# Patient Record
Sex: Female | Born: 1995
Health system: Southern US, Community
[De-identification: ages and names within clinical notes are randomized; demographics above are authoritative.]

## PROBLEM LIST (undated history)

## (undated) DIAGNOSIS — N189 Chronic kidney disease, unspecified: Secondary | ICD-10-CM

## (undated) DIAGNOSIS — E119 Type 2 diabetes mellitus without complications: Secondary | ICD-10-CM

## (undated) DIAGNOSIS — H6690 Otitis media, unspecified, unspecified ear: Secondary | ICD-10-CM

## (undated) HISTORY — DX: Type 2 diabetes mellitus without complications: E11.9

## (undated) HISTORY — DX: Otitis media, unspecified, unspecified ear: H66.90

## (undated) HISTORY — DX: Chronic kidney disease, unspecified: N18.9

---
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
## 2013-04-23 ENCOUNTER — Telehealth: Payer: Self-pay | Admitting: Family Medicine

## 2013-04-23 NOTE — Telephone Encounter (Signed)
Pt is wanting to know if she can get her novo long penn,lantes pen,losartin (pt can not get in touch with endocrinologist jeffery cure to refill it) she is wanting a call back to let her know if this can be done [employer] [address] back [phone]

## 2013-04-25 NOTE — Telephone Encounter (Signed)
LMTRC

## 2013-04-27 NOTE — Telephone Encounter (Signed)
LMTRC

## 2013-05-12 ENCOUNTER — Other Ambulatory Visit: Payer: Self-pay | Admitting: Family Medicine

## 2014-04-20 ENCOUNTER — Emergency Department (HOSPITAL_COMMUNITY)
Admission: EM | Admit: 2014-04-20 | Discharge: 2014-04-20 | Disposition: A | Discharge status: Home / Self Care | Payer: 59 | Source: Home / Self Care | Attending: Family Medicine | Admitting: Family Medicine

## 2014-04-20 ENCOUNTER — Encounter (HOSPITAL_COMMUNITY): Payer: Self-pay | Admitting: Emergency Medicine

## 2014-04-20 DIAGNOSIS — K5901 Slow transit constipation: Secondary | ICD-10-CM

## 2014-04-20 LAB — POCT URINALYSIS DIP (DEVICE)
BILIRUBIN URINE: NEGATIVE
Glucose, UA: 500 mg/dL — AB
KETONES UR: NEGATIVE mg/dL
LEUKOCYTES UA: NEGATIVE
Nitrite: NEGATIVE
PH: 7 (ref 5.0–8.0)
Protein, ur: 30 mg/dL — AB
SPECIFIC GRAVITY, URINE: 1.02 (ref 1.005–1.030)
Urobilinogen, UA: 0.2 mg/dL (ref 0.0–1.0)

## 2014-04-20 LAB — POCT PREGNANCY, URINE: Preg Test, Ur: NEGATIVE

## 2014-04-20 NOTE — ED Notes (Signed)
Pt states that she has had abdominal pain for 2 weeks. Pt reports no bowel movement in over 1 week

## 2014-04-20 NOTE — Discharge Instructions (Signed)
Miralax, citrucel, fluids, fiber and exercise. See your doctor if further problems.

## 2014-04-20 NOTE — ED Provider Notes (Signed)
CSN: [id number]     Arrival date & time 04/20/14  1834 History   First MD Initiated Contact with Patient 04/20/14 1859     Chief Complaint  Patient presents with  . Abdominal Pain   (Consider location/radiation/quality/duration/timing/severity/associated sxs/prior Treatment) Patient is a 18 y.o. female presenting with abdominal pain. The history is provided by the patient and a parent.  Abdominal Pain Pain location:  Generalized Pain quality: cramping   Pain radiates to:  Does not radiate Pain severity:  Mild Onset quality:  Gradual Duration:  2 weeks Chronicity:  Chronic Context: diet changes   Context comment:  No bm for 1 wk, h/o constipation. Associated symptoms: constipation   Associated symptoms: no anorexia, no belching, no chest pain, no diarrhea, no dysuria, no fever and no nausea     Past Medical History  Diagnosis Date  . OM (otitis media), recurrent     Recent bilat rupture of TM Feb 2013  . Diabetes mellitus without complication   . Chronic kidney disease     Stage I   History reviewed. No pertinent past surgical history. Family History  Problem Relation Age of Onset  . Hyperlipidemia Mother   . Depression Mother   . Diabetes Paternal Uncle   . Diabetes Paternal Grandfather    History  Substance Use Topics  . Smoking status: Never Smoker   . Smokeless tobacco: Not on file  . Alcohol Use: No   OB History    No data available     Review of Systems  Constitutional: Negative for fever.  Cardiovascular: Negative for chest pain.  Gastrointestinal: Positive for abdominal pain and constipation. Negative for nausea, diarrhea and anorexia.  Genitourinary: Negative.  Negative for dysuria.    Allergies  Review of patient's allergies indicates no known allergies.  Home Medications   Prior to Admission medications   Medication Sig Start Date End Date Taking? Authorizing Provider  desogestrel-ethinyl estradiol (APRI,EMOQUETTE,SOLIA) 0.15-30 MG-MCG tablet  Take 1 tablet by mouth daily.    Historical Provider, MD  insulin aspart (NOVOLOG) 100 UNIT/ML injection Inject 4 Units into the skin 3 (three) times daily before meals.    Historical Provider, MD  insulin glargine (LANTUS) 100 UNIT/ML injection Inject 12 units into skin at bedtime 09/01/11   Salley ScarletKawanta F [place], MD  Insulin Pen Needle 31G X 8 MM MISC Lantus solarstar Insulin pen needlesDx 250.00. Test CBG QAC, QHS 09/01/11   Salley ScarletKawanta F [place], MD  Lancets MISC Test cbg as directed 09/01/11   Salley ScarletKawanta F [place], MD  losartan (COZAAR) 25 MG tablet Take 25 mg by mouth daily.    Historical Provider, MD  ONE TOUCH ULTRA TEST test strip TEST 4 TIMES DAILY, BEFORE MEALS AND AT BEDTIME. 05/12/13   Salley ScarletKawanta F [place], MD   BP 147/77 mmHg  Pulse 106  Temp(Src) 99.4 F (37.4 C) (Oral)  Resp 16  SpO2 100%  LMP 04/16/2014 Physical Exam  Constitutional: She is oriented to person, place, and time. She appears well-developed and well-nourished. No distress.  Neck: Normal range of motion. Neck supple.  Cardiovascular: Regular rhythm and normal heart sounds.   Pulmonary/Chest: Effort normal and breath sounds normal.  Abdominal: Soft. Bowel sounds are normal. She exhibits no distension and no mass. There is no tenderness. There is no rebound and no guarding.  Lymphadenopathy:    She has no cervical adenopathy.  Neurological: She is alert and oriented to person, place, and time.  Skin: Skin is warm and dry.  Nursing note  and vitals reviewed.   ED Course  Procedures (including critical care time) Labs Review Labs Reviewed  POCT URINALYSIS DIP (DEVICE) - Abnormal; Notable for the following:    Glucose, UA 500 (*)    Hgb urine dipstick TRACE (*)    Protein, ur 30 (*)    All other components within normal limits  POCT PREGNANCY, URINE    Imaging Review No results found.   MDM   1. Slow transit constipation        Linna HoffJames D Emillio Ngo, MD 04/20/14 1932

## 2014-04-22 ENCOUNTER — Encounter: Payer: Self-pay | Admitting: Physician Assistant

## 2014-04-22 ENCOUNTER — Ambulatory Visit (INDEPENDENT_AMBULATORY_CARE_PROVIDER_SITE_OTHER): Payer: 59 | Admitting: Physician Assistant

## 2014-04-22 VITALS — BP 110/76 | HR 100 | Temp 99.2°F | Resp 18 | Wt 156.0 lb

## 2014-04-22 DIAGNOSIS — E109 Type 1 diabetes mellitus without complications: Secondary | ICD-10-CM

## 2014-04-22 DIAGNOSIS — K5900 Constipation, unspecified: Secondary | ICD-10-CM

## 2014-04-22 DIAGNOSIS — R1033 Periumbilical pain: Secondary | ICD-10-CM

## 2014-04-22 NOTE — Progress Notes (Signed)
Patient ID: Gabriel RainwaterKija Emonie Broccoli MRN: [id number], DOB: Apr 09, 1996, 18 y.o. Date of Encounter: @DATE @  Chief Complaint:  Chief Complaint  Patient presents with  . /abd pain    no BM in a week,  went to UC told to take Miralax and Metamucil but no help    HPI: 18 y.o. year old AA female  presents with above symptoms.   I reviewed urgent care note from 04/20/14. They performed a urine and a urine pregnancy which was negative.  Patient says that since summer time ( approx 6 months)  she has had some mild constipation. Says that she would have to strain and her stool would be hard. However it was not requiring any treatment.  She went to the urgent care on 04/20/14. She says that the week prior to that urgent care visit she wasn't having a lot of bowel movement but was having some until just 2 days prior to the urgent care visit-- she had not had BM any at all for 2 days prior to U/C visit.  The urgent care recommended MiraLAX and Metamucil. She says that since the urgent care visit she has been using both of these. She is taking MiraLAX one cap full per day. She is taking Metamucil 1 tablespoon 3 times a day. Says that she has also increased fluid intake. She says that she has had no BM come out at all.  She reports that she has no GI/abdominal history. She reports that she has never had any abdominal surgeries.   Past Medical History  Diagnosis Date  . OM (otitis media), recurrent     Recent bilat rupture of TM Feb 2013  . Diabetes mellitus without complication   . Chronic kidney disease     Stage I     Home Meds: Outpatient Prescriptions Prior to Visit  Medication Sig Dispense Refill  . desogestrel-ethinyl estradiol (APRI,EMOQUETTE,SOLIA) 0.15-30 MG-MCG tablet Take 1 tablet by mouth daily.    . insulin aspart (NOVOLOG) 100 UNIT/ML injection Inject 4 Units into the skin 3 (three) times daily before meals.    . Insulin Pen Needle 31G X 8 MM MISC Lantus solarstar Insulin  pen needlesDx 250.00. Test CBG QAC, QHS 100 each 6  . Lancets MISC Test cbg as directed 100 each 6  . losartan (COZAAR) 25 MG tablet Take 25 mg by mouth daily.    . ONE TOUCH ULTRA TEST test strip TEST 4 TIMES DAILY, BEFORE MEALS AND AT BEDTIME. 150 each 11  . insulin glargine (LANTUS) 100 UNIT/ML injection Inject 12 units into skin at bedtime     No facility-administered medications prior to visit.    Allergies: No Known Allergies  History   Social History  . Marital Status: Single    Spouse Name: N/A    Number of Children: N/A  . Years of Education: N/A   Occupational History  . Not on file.   Social History Main Topics  . Smoking status: Never Smoker   . Smokeless tobacco: Not on file  . Alcohol Use: No  . Drug Use: No  . Sexual Activity: Not on file   Other Topics Concern  . Not on file   Social History Narrative    Family History  Problem Relation Age of Onset  . Hyperlipidemia Mother   . Depression Mother   . Diabetes Paternal Uncle   . Diabetes Paternal Grandfather      Review of Systems:  See HPI for pertinent ROS. All  other ROS negative.    Physical Exam: Blood pressure 110/76, pulse 100, temperature 99.2 F (37.3 C), temperature source Oral, resp. rate 18, weight 156 lb (70.761 kg), last menstrual period 04/16/2014., Body mass index is 27.4 kg/(m^2). General: WNWD AAF. Appears in no distress at all. Appears in no acute distress. Neck: Supple. No thyromegaly. No lymphadenopathy. Lungs: Clear bilaterally to auscultation without wheezes, rales, or rhonchi. Breathing is unlabored. Heart: RRR with S1 S2. No murmurs, rubs, or gallops. Abdomen: Soft,  non-distended with normoactive bowel sounds. No hepatomegaly. No rebound/guarding. No obvious abdominal masses. Very minimal tenderness with palpation of periumbilical area. No other area of tenderness. No tenderness with palpation of right lower quadrant.  Musculoskeletal:  Strength and tone normal for  age. Extremities/Skin: Warm and dry. Neuro: Alert and oriented X 3. Moves all extremities spontaneously. Gait is normal. CNII-XII grossly in tact. Psych:  Responds to questions appropriately with a normal affect.     ASSESSMENT AND PLAN:  [age] y.o. year old female with  1. Constipation, unspecified constipation type - DG Abd 1 View; Future  2. Periumbilical abdominal pain - DG Abd 1 View; Future  3. Type I diabetes mellitus, well controlled  Will obtain KUB. If this is normal except for constipation, then will have her increase Mirilax.  F/U if symptoms worsen/change.    [address]igned, Charmika Macdonnell [place], [place], [facility] 04/22/2014 4:38 PM

## 2014-04-23 ENCOUNTER — Ambulatory Visit
Admission: RE | Admit: 2014-04-23 | Discharge: 2014-04-23 | Disposition: A | Discharge status: Home / Self Care | Payer: 59 | Source: Ambulatory Visit | Attending: Physician Assistant | Admitting: Physician Assistant

## 2014-04-23 DIAGNOSIS — R1033 Periumbilical pain: Secondary | ICD-10-CM

## 2014-04-23 DIAGNOSIS — K5900 Constipation, unspecified: Secondary | ICD-10-CM

## 2014-04-24 ENCOUNTER — Telehealth: Payer: Self-pay | Admitting: Family Medicine

## 2014-04-24 NOTE — Telephone Encounter (Signed)
Patient is calling to get xray results  [phone]

## 2014-04-24 NOTE — Telephone Encounter (Signed)
Patient returned call and made aware.

## 2014-04-25 ENCOUNTER — Emergency Department (HOSPITAL_COMMUNITY)
Admission: EM | Admit: 2014-04-25 | Discharge: 2014-04-25 | Disposition: A | Discharge status: Home / Self Care | Payer: 59 | Attending: Emergency Medicine | Admitting: Emergency Medicine

## 2014-04-25 ENCOUNTER — Emergency Department (HOSPITAL_COMMUNITY): Payer: 59

## 2014-04-25 ENCOUNTER — Encounter (HOSPITAL_COMMUNITY): Payer: Self-pay | Admitting: Emergency Medicine

## 2014-04-25 DIAGNOSIS — N3000 Acute cystitis without hematuria: Secondary | ICD-10-CM | POA: Diagnosis not present

## 2014-04-25 DIAGNOSIS — Z3202 Encounter for pregnancy test, result negative: Secondary | ICD-10-CM | POA: Diagnosis not present

## 2014-04-25 DIAGNOSIS — E119 Type 2 diabetes mellitus without complications: Secondary | ICD-10-CM | POA: Insufficient documentation

## 2014-04-25 DIAGNOSIS — R109 Unspecified abdominal pain: Secondary | ICD-10-CM | POA: Diagnosis present

## 2014-04-25 DIAGNOSIS — N181 Chronic kidney disease, stage 1: Secondary | ICD-10-CM | POA: Insufficient documentation

## 2014-04-25 DIAGNOSIS — Z8669 Personal history of other diseases of the nervous system and sense organs: Secondary | ICD-10-CM | POA: Insufficient documentation

## 2014-04-25 DIAGNOSIS — R1033 Periumbilical pain: Secondary | ICD-10-CM

## 2014-04-25 DIAGNOSIS — Z794 Long term (current) use of insulin: Secondary | ICD-10-CM | POA: Insufficient documentation

## 2014-04-25 DIAGNOSIS — Z79899 Other long term (current) drug therapy: Secondary | ICD-10-CM | POA: Diagnosis not present

## 2014-04-25 DIAGNOSIS — K5900 Constipation, unspecified: Secondary | ICD-10-CM | POA: Diagnosis not present

## 2014-04-25 LAB — URINALYSIS, ROUTINE W REFLEX MICROSCOPIC
Bilirubin Urine: NEGATIVE
Glucose, UA: >1000 mg/dL — AB
Ketones, ur: 15 mg/dL — AB
Nitrite: NEGATIVE
Protein, ur: NEGATIVE mg/dL
Specific Gravity, Urine: 1.019 (ref 1.005–1.030)
Urobilinogen, UA: 1 mg/dL (ref 0.0–1.0)
pH: 7 (ref 5.0–8.0)

## 2014-04-25 LAB — CBC WITH DIFFERENTIAL/PLATELET
Basophils Absolute: 0.1 10*3/uL (ref 0.0–0.1)
Basophils Relative: 1 % (ref 0–1)
Eosinophils Absolute: 0 10*3/uL (ref 0.0–0.7)
Eosinophils Relative: 0 % (ref 0–5)
HCT: 37.8 % (ref 36.0–46.0)
Hemoglobin: 13.2 g/dL (ref 12.0–15.0)
Lymphocytes Relative: 51 % — ABNORMAL HIGH (ref 12–46)
Lymphs Abs: 4.4 10*3/uL — ABNORMAL HIGH (ref 0.7–4.0)
MCH: 31.9 pg (ref 26.0–34.0)
MCHC: 34.9 g/dL (ref 30.0–36.0)
MCV: 91.3 fL (ref 78.0–100.0)
Monocytes Absolute: 0.7 10*3/uL (ref 0.1–1.0)
Monocytes Relative: 8 % (ref 3–12)
Neutro Abs: 3.5 10*3/uL (ref 1.7–7.7)
Neutrophils Relative %: 40 % — ABNORMAL LOW (ref 43–77)
Platelets: 261 10*3/uL (ref 150–400)
RBC: 4.14 MIL/uL (ref 3.87–5.11)
RDW: 13 % (ref 11.5–15.5)
WBC: 8.7 10*3/uL (ref 4.0–10.5)

## 2014-04-25 LAB — COMPREHENSIVE METABOLIC PANEL
ALT: 37 U/L — ABNORMAL HIGH (ref 0–35)
AST: 33 U/L (ref 0–37)
Albumin: 3.2 g/dL — ABNORMAL LOW (ref 3.5–5.2)
Alkaline Phosphatase: 131 U/L — ABNORMAL HIGH (ref 39–117)
Anion gap: 13 (ref 5–15)
BUN: 10 mg/dL (ref 6–23)
CO2: 24 mEq/L (ref 19–32)
Calcium: 9.6 mg/dL (ref 8.4–10.5)
Chloride: 97 mEq/L (ref 96–112)
Creatinine, Ser: 0.6 mg/dL (ref 0.50–1.10)
GFR calc Af Amer: >90 mL/min (ref >90)
GFR calc non Af Amer: >90 mL/min (ref >90)
Glucose, Bld: 286 mg/dL — ABNORMAL HIGH (ref 70–99)
Potassium: 4.2 mEq/L (ref 3.7–5.3)
Sodium: 134 mEq/L — ABNORMAL LOW (ref 137–147)
Total Bilirubin: 0.5 mg/dL (ref 0.3–1.2)
Total Protein: 7.4 g/dL (ref 6.0–8.3)

## 2014-04-25 LAB — URINE MICROSCOPIC-ADD ON

## 2014-04-25 LAB — LIPASE, BLOOD: Lipase: 41 U/L (ref 11–59)

## 2014-04-25 LAB — PREGNANCY, URINE: Preg Test, Ur: NEGATIVE

## 2014-04-25 MED ORDER — IBUPROFEN 800 MG PO TABS: 800.0000 mg | ORAL_TABLET | Freq: Three times a day (TID) | ORAL | Status: DC | PRN

## 2014-04-25 MED ORDER — NITROFURANTOIN MONOHYD MACRO 100 MG PO CAPS: 100.0000 mg | ORAL_CAPSULE | Freq: Two times a day (BID) | ORAL | Status: DC

## 2014-04-25 MED ORDER — SODIUM CHLORIDE 0.9 % IV BOLUS (SEPSIS)
1000.0000 mL | Freq: Once | INTRAVENOUS | Status: AC
  Administered 2014-04-25: 1000 mL via INTRAVENOUS

## 2014-04-25 MED ORDER — KETOROLAC TROMETHAMINE 30 MG/ML IJ SOLN
30.0000 mg | Freq: Once | INTRAMUSCULAR | Status: AC
  Administered 2014-04-25: 30 mg via INTRAVENOUS
  Filled 2014-04-25: qty 1

## 2014-04-25 MED ORDER — IOHEXOL 300 MG/ML  SOLN
100.0000 mL | Freq: Once | INTRAMUSCULAR | Status: AC | PRN
  Administered 2014-04-25: 100 mL via INTRAVENOUS

## 2014-04-25 MED ORDER — IOHEXOL 300 MG/ML  SOLN
25.0000 mL | Freq: Once | INTRAMUSCULAR | Status: AC | PRN
  Administered 2014-04-25: 25 mL via ORAL

## 2014-04-25 MED ORDER — LACTULOSE 10 GM/15ML PO SOLN: 10.0000 g | Freq: Two times a day (BID) | ORAL | Status: DC

## 2014-04-25 NOTE — ED Notes (Signed)
Pt having L abdominal movement. Saturday was last BM; nauseated. Fevers since Thursday. [facility] and was given medicine for back pain. Pt is Type I diabetic. Pain started in back and moved to stomach. Has seen 3 MD's and fevers persisting. Had abdominal xray and no obstruction.

## 2014-04-25 NOTE — ED Notes (Signed)
CT called to update that pt finished contrast

## 2014-04-25 NOTE — ED Notes (Signed)
Pt returned from CT °

## 2014-04-25 NOTE — Discharge Instructions (Signed)
Return here as needed.  Follow-up with your primary care Dr. increase her fluid intake, rest as much as possible.  Tylenol and Motrin for any pain or fever

## 2014-04-25 NOTE — ED Provider Notes (Signed)
CSN: [id number]     Arrival date & time 04/25/14  [id number] History   First MD Initiated Contact with Patient 04/25/14 1005     Chief Complaint  Patient presents with  . Abdominal Pain     (Consider location/radiation/quality/duration/timing/severity/associated sxs/prior Treatment) HPI Patient presents to the emergency department with abdominal pain that has been ongoing over the last week.  The patient, states she has had also constipation over that time frame.  She was seen in urgent care for similar symptoms.  Patient, states she has been nauseated with fevers.  The patient states that nothing seems to make her condition better or worse.  The patient denies headache, blurred vision, weakness, dizziness, lethargy, chest pain, shortness of breath, cough, runny nose, sore throat, back pain, dysuria, lightheadedness or syncope or syncope.  The patient states that she did not take any other medications prior to arrival.  The patient states that nothing seems make her condition better or worse. Past Medical History  Diagnosis Date  . OM (otitis media), recurrent     Recent bilat rupture of TM Feb 2013  . Diabetes mellitus without complication   . Chronic kidney disease     Stage I   History reviewed. No pertinent past surgical history. Family History  Problem Relation Age of Onset  . Hyperlipidemia Mother   . Depression Mother   . Diabetes Paternal Uncle   . Diabetes Paternal Grandfather    History  Substance Use Topics  . Smoking status: Never Smoker   . Smokeless tobacco: Not on file  . Alcohol Use: No   OB History    No data available     Review of Systems All other systems negative except as documented in the HPI. All pertinent positives and negatives as reviewed in the HPI.   Allergies  Review of patient's allergies indicates no known allergies.  Home Medications   Prior to Admission medications   Medication Sig Start Date End Date Taking? Authorizing Provider    ibuprofen (ADVIL,MOTRIN) 200 MG tablet Take 200 mg by mouth every 6 (six) hours as needed for mild pain or moderate pain.   Yes Historical Provider, MD  insulin aspart (NOVOLOG) 100 UNIT/ML injection Inject 4 Units into the skin 3 (three) times daily before meals.   Yes Historical Provider, MD  Insulin Pen Needle 31G X 8 MM MISC Lantus solarstar Insulin pen needlesDx 250.00. Test CBG QAC, QHS 09/01/11  Yes Salley ScarletKawanta F [place], MD  Lancets MISC Test cbg as directed 09/01/11  Yes Salley ScarletKawanta F [place], MD  losartan (COZAAR) 25 MG tablet Take 25 mg by mouth daily.   Yes Historical Provider, MD  ONE TOUCH ULTRA TEST test strip TEST 4 TIMES DAILY, BEFORE MEALS AND AT BEDTIME. 05/12/13  Yes Salley ScarletKawanta F , MD  TRESIBA FLEXTOUCH 100 UNIT/ML SOPN Inject 12 Units into the skin at bedtime. 04/05/14  Yes Historical Provider, MD   BP 132/80 mmHg  Pulse 98  Temp(Src) 99.8 F (37.7 C) (Oral)  Resp 22  SpO2 100%  LMP 04/16/2014 (Approximate) Physical Exam  Constitutional: She is oriented to person, place, and time. She appears well-developed and well-nourished. No distress.  HENT:  Head: Normocephalic and atraumatic.  Mouth/Throat: Oropharynx is clear and moist.  Eyes: Pupils are equal, round, and reactive to light.  Neck: Normal range of motion. Neck supple.  Cardiovascular: Normal rate, regular rhythm and normal heart sounds.  Exam reveals no gallop and no friction rub.   No murmur heard. Pulmonary/Chest: Effort  normal and breath sounds normal. No respiratory distress.  Abdominal: Soft. Bowel sounds are normal. She exhibits no distension. There is tenderness. There is no rebound and no guarding.    Musculoskeletal: She exhibits no edema.  Neurological: She is alert and oriented to person, place, and time. She exhibits normal muscle tone. Coordination normal.  Skin: Skin is warm and dry. No erythema.  Psychiatric: She has a normal mood and affect. Her behavior is normal.  Nursing note and vitals  reviewed.   ED Course  Procedures (including critical care time) Labs Review Labs Reviewed  URINALYSIS, ROUTINE W REFLEX MICROSCOPIC - Abnormal; Notable for the following:    Glucose, UA >1000 (*)    Hgb urine dipstick TRACE (*)    Ketones, ur 15 (*)    Leukocytes, UA MODERATE (*)    All other components within normal limits  COMPREHENSIVE METABOLIC PANEL - Abnormal; Notable for the following:    Sodium 134 (*)    Glucose, Bld 286 (*)    Albumin 3.2 (*)    ALT 37 (*)    Alkaline Phosphatase 131 (*)    All other components within normal limits  CBC WITH DIFFERENTIAL - Abnormal; Notable for the following:    Neutrophils Relative % 40 (*)    Lymphocytes Relative 51 (*)    Lymphs Abs 4.4 (*)    All other components within normal limits  URINE CULTURE  PREGNANCY, URINE  LIPASE, BLOOD  URINE MICROSCOPIC-ADD ON    Imaging Review Ct Abdomen Pelvis W Contrast  04/25/2014   CLINICAL DATA:  Lower abdomen and back pain for 2 weeks radiating towards umbilicus, nausea  EXAM: CT ABDOMEN AND PELVIS WITH CONTRAST  TECHNIQUE: Multidetector CT imaging of the abdomen and pelvis was performed using the standard protocol following bolus administration of intravenous contrast.  CONTRAST:  100mL OMNIPAQUE IOHEXOL 300 MG/ML  SOLN  COMPARISON:  Abdomen film of 04/23/2014  FINDINGS: The lung bases are clear other than mild bibasilar dependent atelectasis. If there are any clinical symptoms referable to pneumonia, developing pneumonia at the lung bases would be difficult to exclude and followup would be recommended.  The liver enhances with no focal abnormality and no ductal dilatation is seen. No calcified gallstones are noted. The pancreas is normal in size in the pancreatic duct is not dilated. The adrenal glands and spleen are unremarkable. The stomach is decompressed. The kidneys enhance with no calculus or mass and there is no evidence of hydronephrosis. The abdominal aorta is normal in caliber. No  adenopathy is seen.  The uterus is slightly to the left midline. A small amount of free fluid is noted in the pelvis. The urinary bladder is unremarkable. No adnexal lesion is seen with probable small follicles present bilaterally. There is a moderate amount of feces throughout the colon. The terminal ileum and the appendix are unremarkable. The lumbar vertebrae are in normal alignment with normal disc spaces.  IMPRESSION: 1. No definite explanation for the patient's pain is seen. There is a small amount of free fluid in the pelvis and a ruptured ovarian cyst cannot be excluded. 2. The appendix and terminal ileum are unremarkable. 3. Probable bibasilar dependent atelectasis. If there are any clinical symptoms referable to developing pneumonia then followup chest x-ray may be helpful.   Electronically Signed   By: Dwyane DeePaul  Barry M.D.   On: 04/25/2014 13:14    Patient does have some white cells noted in the urine.  We will treat her for this  as a presumptive measured due to the fact she is diabetic.  The patient's vital signs of improvement.  Patient is feeling some better.  I will treat for constipation as she is had some treatment with MiraLAX over the last few days.  The patient is advised to increase her fluid intake, rest as much as possible.  Follow with her primary care doctor.  Return here for any worsening in her condition  MDM   Final diagnoses:  None       Carlyle Dolly, PA-C 04/25/14 1500  Mirian Mo, MD 04/29/14 [phone]

## 2014-04-29 ENCOUNTER — Telehealth: Payer: Self-pay | Admitting: Family Medicine

## 2014-04-29 LAB — URINE CULTURE: Colony Count: >=100000

## 2014-04-29 MED ORDER — FLUCONAZOLE 150 MG PO TABS: 150.0000 mg | ORAL_TABLET | Freq: Once | ORAL | Status: DC

## 2014-04-29 NOTE — Telephone Encounter (Signed)
Seen in ED last week.  Was given antibiotics.  Now developing yeast infection.  Wants Rx if possible.

## 2014-04-29 NOTE — Telephone Encounter (Signed)
Okay to send in diflucan x 1 , repeat in 3 days

## 2014-04-29 NOTE — Telephone Encounter (Signed)
RX to pharmacy and pt aware 

## 2014-04-30 ENCOUNTER — Telehealth (HOSPITAL_COMMUNITY): Payer: Self-pay

## 2014-04-30 NOTE — ED Notes (Signed)
Post ED Visit - Positive Culture Follow-up  Culture report reviewed by antimicrobial stewardship pharmacist: [x]  Wes Dulaney, Pharm.D., BCPS []  Celedonio MiyamotoJeremy Frens, Pharm.D., BCPS []  Georgina PillionElizabeth Martin, Pharm.D., BCPS []  GarnerMinh Pham, [address]Ph[address]D., BCPS, AAHIVP []  Estella HuskMichelle Turner, Pharm.D., BCPS, AAHIVP []  Elder CyphersLorie Poole, [address]Pharm[address]D., BCPS  Positive urine culture Treated with nitrofurantoin, organism sensitive to the same and no further patient follow-up is required at this time.  Ashley JacobsFesterman, Adrea Sherpa C 04/30/2014, 10:33 AM

## 2014-05-01 ENCOUNTER — Encounter: Payer: Self-pay | Admitting: Family Medicine

## 2014-05-01 ENCOUNTER — Other Ambulatory Visit: Payer: Self-pay | Admitting: Family Medicine

## 2014-05-01 ENCOUNTER — Ambulatory Visit (INDEPENDENT_AMBULATORY_CARE_PROVIDER_SITE_OTHER): Payer: 59 | Admitting: Family Medicine

## 2014-05-01 ENCOUNTER — Telehealth: Payer: Self-pay | Admitting: Family Medicine

## 2014-05-01 VITALS — BP 118/70 | HR 86 | Temp 98.6°F | Resp 18 | Ht 65.0 in | Wt 153.0 lb

## 2014-05-01 DIAGNOSIS — R7982 Elevated C-reactive protein (CRP): Secondary | ICD-10-CM

## 2014-05-01 DIAGNOSIS — R1032 Left lower quadrant pain: Secondary | ICD-10-CM

## 2014-05-01 DIAGNOSIS — N760 Acute vaginitis: Secondary | ICD-10-CM

## 2014-05-01 LAB — WET PREP FOR TRICH, YEAST, CLUE
Trich, Wet Prep: NONE SEEN
Yeast Wet Prep HPF POC: NONE SEEN

## 2014-05-01 MED ORDER — SULFAMETHOXAZOLE-TRIMETHOPRIM 800-160 MG PO TABS: 1.0000 | ORAL_TABLET | Freq: Two times a day (BID) | ORAL | Status: DC

## 2014-05-01 NOTE — Telephone Encounter (Signed)
Patient is calling to give more information about an emergency appointment she is supposed to have  [phone]

## 2014-05-01 NOTE — Patient Instructions (Signed)
Stop the macrobid Start bactrim Take 1 diflucan now and repeat at end of antibiotics Get labs done Next week  F/U pending results

## 2014-05-01 NOTE — Telephone Encounter (Signed)
Notes and labs need to be obtained, I have not seen her since last year and I did not send her to GI

## 2014-05-01 NOTE — Telephone Encounter (Signed)
Call placed to patient.   Reports that she was seen by GI and was advised to F/U with PCP d/t elevated CRP.  Appointment scheduled.

## 2014-05-01 NOTE — Progress Notes (Signed)
Patient ID: Nicole RainwaterKija Emonie Hayward, female   DOB: 04/02/1996, 18 y.o.   MRN: [id number]   Subjective:    Patient ID: Nicole Duarte, female    DOB: 04/02/1996, 18 y.o.   MRN: [id number]  Patient presents for F/U Labs patient here to follow-up. She was seen on the 15th secondary to left lower quadrant pain a KUB was done which shows severe constipation which she had been having difficulty with. She then went to the ER 2 days later because she was having some fever as well as persistent pain CT scan was done which was unremarkable with the exception of a small amount of free pelvic fluid consistent with a ruptured ovarian cyst. She was started on antibiotics secondary to possible urine tract infection and the culture did come back with staph aureus she was already on Macrobid she did not have any significant UTI symptoms. She does state that the antibiotic is causing itching and she now has vaginal discharge. She then went to follow with gastroenterology because CT scan did show the constipation which was already revealed on the x-ray. Labs were done which showed an elevated CRP at 132 which suggested a possible inflammatory process her thyroid level was unremarkable she had some other transglutaminase levels looking for celiac which were also negative Upreg neg LMP 04/17/14 Abdominal pain at this point is very minimal, she also now has some cough and congestion    Review Of Systems:  GEN- denies fatigue, fever, weight loss,weakness, recent illness HEENT- denies eye drainage, change in vision, nasal discharge, CVS- denies chest pain, palpitations RESP- denies SOB, cough, wheeze ABD- denies N/V, change in stools, +abd pain GU- denies dysuria, hematuria, dribbling, incontinence MSK- denies joint pain, muscle aches, injury Neuro- denies headache, dizziness, syncope, seizure activity       Objective:    BP 118/70 mmHg  Pulse 86  Temp(Src) 98.6 F (37 C) (Oral)  Resp 18  Ht 5\' 5"  (1.651 m)   Wt 153 lb (69.4 kg)  BMI 25.46 kg/m2  LMP 04/16/2014 (Approximate) GEN- NAD, alert and oriented x3 HEENT- PERRL, EOMI, non injected sclera, pink conjunctiva, MMM, oropharynx clear, no maxillary sinus tenderness, nares clear, hoarse voice Neck- Supple, no LAD CVS- RRR, no murmur RESP-CTAB ABD-NABS,soft,mild TTP LLQ no rebound, no gaurding GU- normal external genitalia, vaginal mucosa pink and moist, cervix visualized no growth, no blood form os, + THICK WHITE discharge, no CMT, no ovarian masses, uterus normal size Pulses- Radial 2+        Assessment & Plan:      Problem List Items Addressed This Visit    None    Visit Diagnoses    Vaginitis and vulvovaginitis    -  Primary    Advised to take Diflucan x 1 dose now and repeat at end of antibotics, in setting of her DM, she does get Yeast infection a lot    Relevant Orders       GC/chlamydia probe amp, genital       WET PREP FOR TRICH, YEAST, CLUE (Completed)    Elevated C-reactive protein (CRP)        Plan to recheck her CRP, sed rate, and CBC    Relevant Orders       C-reactive protein       Sedimentation Rate    Abdominal pain, left lower quadrant        possible related to both constipation and ovarian cyst, pelvic culture done, will hold on pelvic US unless  pain returns, will d/c macrobid and chagne to bactrim    Relevant Orders       CBC with Differential       Comprehensive metabolic panel       Note: This dictation was prepared with Dragon dictation along with smaller phrase technology. Any transcriptional errors that result from this process are unintentional.

## 2014-05-02 LAB — GC/CHLAMYDIA PROBE AMP
CT PROBE, AMP APTIMA: NEGATIVE
GC PROBE AMP APTIMA: NEGATIVE

## 2014-05-09 ENCOUNTER — Encounter: Payer: Self-pay | Admitting: *Deleted

## 2014-05-15 ENCOUNTER — Encounter: Payer: Self-pay | Admitting: *Deleted

## 2014-05-15 ENCOUNTER — Encounter: Payer: Self-pay | Attending: Internal Medicine | Admitting: *Deleted

## 2014-05-15 DIAGNOSIS — Z713 Dietary counseling and surveillance: Secondary | ICD-10-CM | POA: Insufficient documentation

## 2014-05-15 DIAGNOSIS — E109 Type 1 diabetes mellitus without complications: Secondary | ICD-10-CM | POA: Insufficient documentation

## 2014-05-15 DIAGNOSIS — Z794 Long term (current) use of insulin: Secondary | ICD-10-CM | POA: Insufficient documentation

## 2014-05-15 NOTE — Patient Instructions (Signed)
Plan: Consider trying new infusion set: Inset 30 to see if you get better delivery from your T-Slim Consider using Food Group Carb Counting when gram info not available If the new infusion set isn't successful we will discuss other pump company options at next visit.

## 2014-05-15 NOTE — Progress Notes (Signed)
Diabetes Self-Management Education  Visit Type:  Initial visit for pump evaluation  Appt. Start Time: 1030 Appt. End Time: 1200  05/15/2014  Nicole Duarte, identified by name and date of birth, is a 19 y.o. female with a diagnosis of Diabetes: Type 1 (Diagnosed 08/2011).  Other people present during visit:  Patient, Family Member   ASSESSMENT  Last menstrual period 04/16/2014. There is no weight on file to calculate BMI.  Initial Visit Information:  Are you currently following a meal plan?: Yes What type of meal plan do you follow?: 1 unit per 12 grams Carb, she feels her carb counting ability is a 6 out of 10 Are you taking your medications as prescribed?: Yes Are you checking your feet?: Yes How many days per week are you checking your feet?: 7 How often do you need to have someone help you when you read instructions, pamphlets, or other written materials from your doctor or pharmacy?: 1 - Never What is the last grade level you completed in school?: college, pre-med, biology  Psychosocial:     Self-care barriers: None Self-management support: Family Other persons present: Patient, Family Member  Complications:   Last HgB A1C per patient/outside source: 7.5 % per patient report How often do you check your blood sugar?: 3-4 times/day Number of hypoglycemic episodes per month: 4 Have you had a dental exam in the past 12 months?: Yes  Diet Intake:  Breakfast: Malaw[place]urkey bacon, eggs, 2 pancakes with SF syrup, water Snack (morning): fresh fruit Lunch: eats on campus, sushi OR Chic Filet sandwich on whole wheat with cheese at food court, water Snack (afternoon): not usually Dinner: fish, vegetables cooked or salad, occasionally a potato, water Snack (evening): depends on BG, will eat if BG is lower Beverage(s): water  Exercise:  Exercise: Moderate (swimming / aerobic walking) (runs at gym for 30 minutes and other exercises for total of an hour as able, not  consistent) Moderate Exercise amount of time (min / week): 150  Individualized Plan for Diabetes Self-Management Training:   Learning Objective:  Patient will have a greater understanding of diabetes self-management.  Patient education plan per assessed needs and concerns is to attend individual sessions for     Education Topics Reviewed with Patient Today:    Carbohydrate counting, Information on hints to eating out and maintain blood glucose control.   Other (comment) (Patient typically on T-Slim pump, has come off due to poor results. She feels it may have been infusion set related. I offered her the Inset 30 and demonstrated it's use. She plans to use it and see if BG's respond when she resumes the T-Slim pump)       Other (comment) (Informed patient of DM 1 Support Group )      PATIENTS GOALS/Plan (Developed by the patient):  Nutrition: Adjust meds/carbs with exercise as discussed Physical Activity: Exercise 3-5 times per week Monitoring : test my blood glucose as discussed (note x per day with comment) (She states she bleeds exessively with Dexcom CGM. I suggested the use of ice at the site prior to insertion to help reduce risk of bleeding. ) Problem Solving:  (She will try Inset 30 infusion set with current T-Slim pump. If not successful, we will meet agiain to reveiw other pump company options. )  Plan:   Patient Instructions  Plan: Consider trying new infusion set: Inset 30 to see if you get better delivery from your T-Slim Consider using Food Group Carb Counting when gram info not  available If the new infusion set isn't successful we will discuss other pump company options at next visit.   Expected Outcomes:  Demonstrated interest in learning. Expect positive outcomes  Education material provided: Meal plan card and Carbohydrate counting sheet, DM 1 Support Group flyer, Instructions for Inset 30 infusion set  If problems or questions, patient to contact team via:   Phone and Email  Future DSME appointment: 4-6 wks

## 2014-05-20 ENCOUNTER — Encounter: Payer: Self-pay | Admitting: Family Medicine

## 2014-05-21 ENCOUNTER — Encounter: Payer: Self-pay | Admitting: Family Medicine

## 2014-05-21 ENCOUNTER — Other Ambulatory Visit: Payer: Self-pay | Admitting: Family Medicine

## 2014-06-18 ENCOUNTER — Telehealth: Payer: Self-pay | Admitting: Family Medicine

## 2014-06-19 ENCOUNTER — Ambulatory Visit: Payer: 59 | Admitting: *Deleted

## 2014-07-01 ENCOUNTER — Ambulatory Visit (INDEPENDENT_AMBULATORY_CARE_PROVIDER_SITE_OTHER): Payer: 59 | Admitting: Internal Medicine

## 2014-07-01 VITALS — BP 136/82 | HR 85 | Temp 98.1°F | Resp 17 | Ht 64.5 in | Wt 156.2 lb

## 2014-07-01 DIAGNOSIS — J988 Other specified respiratory disorders: Secondary | ICD-10-CM

## 2014-07-01 DIAGNOSIS — J9801 Acute bronchospasm: Secondary | ICD-10-CM

## 2014-07-01 DIAGNOSIS — J4520 Mild intermittent asthma, uncomplicated: Secondary | ICD-10-CM

## 2014-07-01 DIAGNOSIS — E109 Type 1 diabetes mellitus without complications: Secondary | ICD-10-CM

## 2014-07-01 DIAGNOSIS — J22 Unspecified acute lower respiratory infection: Secondary | ICD-10-CM

## 2014-07-01 MED ORDER — AZITHROMYCIN 250 MG PO TABS: ORAL_TABLET | ORAL | Status: DC

## 2014-07-01 MED ORDER — ALBUTEROL SULFATE HFA 108 (90 BASE) MCG/ACT IN AERS: 2.0000 | INHALATION_SPRAY | Freq: Four times a day (QID) | RESPIRATORY_TRACT | Status: DC | PRN

## 2014-07-01 MED ORDER — HYDROCODONE-HOMATROPINE 5-1.5 MG/5ML PO SYRP: 5.0000 mL | ORAL_SOLUTION | Freq: Four times a day (QID) | ORAL | Status: DC | PRN

## 2014-07-01 MED ORDER — ALBUTEROL SULFATE (2.5 MG/3ML) 0.083% IN NEBU
2.5000 mg | INHALATION_SOLUTION | Freq: Once | RESPIRATORY_TRACT | Status: AC
  Administered 2014-07-01: 2.5 mg via RESPIRATORY_TRACT

## 2014-07-02 NOTE — Progress Notes (Signed)
   Subjective:    Patient ID: Nicole RainwaterKija Emonie Schumm, female    DOB: [birth year], 19 y.o.   MRN: [id number]  HPI  Chief Complaint  Patient presents with  . coughing    dry coughing x 4days, causing headache--- started with nasal congestion although now no trouble breathing . cough is nonproductive for the most part . she has some cough with laughter walking up stairs . last night she couldn't sleep because she coughed all might . coughs and paroxysms to the point of throwing up .///No past history of asthma   . Diabetes    pt believes cold symptoms are increasing glucose level///after 3-4 weeks of out-of-control values she called her endocrinologist Dr. Sharl MaKerr today and he readjusted her insulin doses   . Headache    x 4days--- a headache that is exacerbated by cough or caused by cough /generalized /no vision changes /not dizzy /no nausea associated with a headache /no history of migraines   . Fatigue    x 4days  . dry mouth    x 4 days   Patient Active Problem List   Diagnosis Date Noted  . Fibrocystic breast changes 06/30/2012  . Type I diabetes mellitus, well controlled 06/30/2012  . Candidal intertrigo 09/01/2011   Social History-[employer] pre[employer]  Review of Systems Noncontributory    Objective:   Physical Exam BP 136/82 mmHg  Pulse 85  Temp(Src) 98.1 F (36.7 C) (Oral)  Resp 17  Ht 5' 4.5" (1.638 m)  Wt 156 lb 3.2 oz (70.852 kg)  BMI 26.41 kg/m2  SpO2 97%  LMP 06/09/2014 Conjunctiva clear TMs clear Nares clear Throat slightly red with no exudate No AC or PC nodes Heart regular without murmur Chest has rhonchi at both bases that clear with coughing and a mild wheeze with forced expiration heard bilaterally/forced expiration precipitates coughing  Nebulizer with albuterol performed  Reexam afterward reveals no wheezing and a much improved ventilation without coughing precipitated by forced expiration    Assessment & Plan:  Bronchospasm -RAD (reactive airway disease), mild  intermittent, uncomplicated precipitated by Lower respiratory infection  Type I diabetes mellitus, recent loss of control  Meds ordered this encounter  Medications  . HYDROcodone-homatropine (HYCODAN) 5-1.5 MG/5ML syrup    Sig: Take 5 mLs by mouth every 6 (six) hours as needed.    Dispense:  120 mL    Refill:  0  . azithromycin (ZITHROMAX) 250 MG tablet    Sig: As packaged    Dispense:  6 tablet    Refill:  0  . albuterol (PROVENTIL HFA;VENTOLIN HFA) 108 (90 BASE) MCG/ACT inhaler    Sig: Inhale 2 puffs into the lungs every 6 (six) hours as needed for wheezing or shortness of breath. for 7-10 days as needed     Dispense:  1 Inhaler    Refill:  0

## 2014-07-16 ENCOUNTER — Ambulatory Visit (INDEPENDENT_AMBULATORY_CARE_PROVIDER_SITE_OTHER): Payer: 59 | Admitting: Family Medicine

## 2014-07-16 ENCOUNTER — Encounter: Payer: Self-pay | Admitting: Family Medicine

## 2014-07-16 VITALS — BP 120/70 | HR 68 | Temp 98.6°F | Resp 16 | Ht 64.0 in | Wt 155.0 lb

## 2014-07-16 DIAGNOSIS — G4700 Insomnia, unspecified: Secondary | ICD-10-CM | POA: Diagnosis not present

## 2014-07-16 DIAGNOSIS — F32A Depression, unspecified: Secondary | ICD-10-CM | POA: Insufficient documentation

## 2014-07-16 DIAGNOSIS — F329 Major depressive disorder, single episode, unspecified: Secondary | ICD-10-CM

## 2014-07-16 MED ORDER — TRAZODONE HCL 50 MG PO TABS: 25.0000 mg | ORAL_TABLET | Freq: Every evening | ORAL | Status: DC | PRN

## 2014-07-16 NOTE — Patient Instructions (Signed)
Try the trazodone, take 1/2 table at least 1 hour before sleep, you need at least 6 hours to rest REturn 4 weeks

## 2014-07-16 NOTE — Assessment & Plan Note (Signed)
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 She is not in the financial bind that requires her to work and we discussed this. I think she would benefit from dropping her late night jobs, to give her more time for sleep in her homework. 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 I see no red flags regarding self-harm or harm to others. I will have her follow-up with me in 4 weeks' time to see if she's made some adjustments to her work schedule and how the medication is doing.

## 2014-07-16 NOTE — Progress Notes (Signed)
Patient ID: Nicole Duarte, female   DOB: 08/10/1995, 19 y.o.   MRN: [id number]   Subjective:    Patient ID: Nicole RainwaterKija Emonie Duarte, female    DOB: 08/10/1995, 19 y.o.   MRN: [id number]  Patient presents for Depression  Patient here with low depressed mood which is worsening over the past few months. She states that she talked her mother thinks that she has been like this for at least a year. She feels like she has no time for herself and her days she is very busy and stressed with work and school. She's also neglecting her health which she has type 1 diabetes. She is currently a Holiday representativejunior at [employer] she is Community education officerstudying premed and psychiatry. 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 She then does her homework and will only typically get 2 or 3 hours of sleep. She is in a relationship but there is no strong connection as she is often very guarded and feels like she does not have time for others. She lives in apartment with a roommate but she states her roommate is also going through her own stressors therefore they do not talk a lot. She did seek out help with the school counselor and she's had a few sessions but did not see any benefit from those. Both her mother and her endocrinologist and worried about her mood. She admits to being very tearful and crying episodes she also feels a little anxious at time in her mind races at night time thinking about what she needs to do the next day. She's not had any suicidal ideations.  [facility]  Review Of Systems:  GEN- denies fatigue, fever, weight loss,weakness, recent illness HEENT- denies eye drainage, change in vision, nasal discharge, CVS- denies chest pain, palpitations RESP- denies SOB, cough, wheeze ABD- denies N/V, change in stools, abd pain GU-  denies dysuria, hematuria, dribbling, incontinence MSK- denies joint pain, muscle aches, injury Neuro- denies headache, dizziness, syncope, seizure activity       Objective:    BP 120/70 mmHg  Pulse 68  Temp(Src) 98.6 F (37 C) (Oral)  Resp 16  Ht 5\' 4"  (1.626 m)  Wt 155 lb (70.308 kg)  BMI 26.59 kg/m2  LMP 07/07/2014 (Approximate) GEN- NAD, alert and oriented x3 HEENT- PERRL, EOMI, non injected sclera, pink conjunctiva, MMM, oropharynx clear Neck- Supple, no thyromegaly CVS- RRR, no murmur RESP-CTAB Psych- normal affect, quiet, good eye contact, well groomed, normal speech, no SI, judgement in tact   PHQ 9 score 13      Assessment & Plan:      Problem List Items Addressed This Visit      Unprioritized   Insomnia - Primary   Depression   Relevant Medications   traZODone (DESYREL) tablet      Note: This dictation was prepared with Dragon dictation along with smaller phrase technology. Any transcriptional errors that result from this process are unintentional.

## 2014-08-13 ENCOUNTER — Ambulatory Visit: Payer: 59 | Admitting: Family Medicine

## 2014-09-29 ENCOUNTER — Other Ambulatory Visit: Payer: Self-pay | Admitting: Family Medicine

## 2014-09-30 NOTE — Telephone Encounter (Signed)
Refill appropriate and filled per protocol. 

## 2014-10-11 ENCOUNTER — Inpatient Hospital Stay (HOSPITAL_COMMUNITY)
Admission: AD | Admit: 2014-10-11 | Discharge: 2014-10-12 | Disposition: A | Discharge status: Home / Self Care | Payer: 59 | Source: Ambulatory Visit | Attending: Family Medicine | Admitting: Family Medicine

## 2014-10-11 DIAGNOSIS — O039 Complete or unspecified spontaneous abortion without complication: Secondary | ICD-10-CM | POA: Insufficient documentation

## 2014-10-11 DIAGNOSIS — E119 Type 2 diabetes mellitus without complications: Secondary | ICD-10-CM | POA: Insufficient documentation

## 2014-10-11 DIAGNOSIS — N181 Chronic kidney disease, stage 1: Secondary | ICD-10-CM | POA: Insufficient documentation

## 2014-10-11 DIAGNOSIS — O209 Hemorrhage in early pregnancy, unspecified: Secondary | ICD-10-CM

## 2014-10-11 DIAGNOSIS — Z3A00 Weeks of gestation of pregnancy not specified: Secondary | ICD-10-CM | POA: Insufficient documentation

## 2014-10-12 ENCOUNTER — Inpatient Hospital Stay (HOSPITAL_COMMUNITY): Payer: 59

## 2014-10-12 ENCOUNTER — Encounter (HOSPITAL_COMMUNITY): Payer: Self-pay | Admitting: *Deleted

## 2014-10-12 DIAGNOSIS — O039 Complete or unspecified spontaneous abortion without complication: Secondary | ICD-10-CM | POA: Diagnosis not present

## 2014-10-12 DIAGNOSIS — N181 Chronic kidney disease, stage 1: Secondary | ICD-10-CM | POA: Diagnosis not present

## 2014-10-12 DIAGNOSIS — N939 Abnormal uterine and vaginal bleeding, unspecified: Secondary | ICD-10-CM | POA: Diagnosis present

## 2014-10-12 DIAGNOSIS — Z3A00 Weeks of gestation of pregnancy not specified: Secondary | ICD-10-CM | POA: Diagnosis not present

## 2014-10-12 DIAGNOSIS — E119 Type 2 diabetes mellitus without complications: Secondary | ICD-10-CM | POA: Diagnosis not present

## 2014-10-12 LAB — CBC
HCT: 42.2 % (ref 36.0–46.0)
HEMOGLOBIN: 13.9 g/dL (ref 12.0–15.0)
MCH: 28.8 pg (ref 26.0–34.0)
MCHC: 32.9 g/dL (ref 30.0–36.0)
MCV: 87.6 fL (ref 78.0–100.0)
Platelets: 254 10*3/uL (ref 150–400)
RBC: 4.82 MIL/uL (ref 3.87–5.11)
RDW: 13.5 % (ref 11.5–15.5)
WBC: 5.3 10*3/uL (ref 4.0–10.5)

## 2014-10-12 LAB — WET PREP, GENITAL
CLUE CELLS WET PREP: NONE SEEN
TRICH WET PREP: NONE SEEN
YEAST WET PREP: NONE SEEN

## 2014-10-12 LAB — HCG, QUANTITATIVE, PREGNANCY: hCG, Beta Chain, Quant, S: 7484 m[IU]/mL — ABNORMAL HIGH (ref <5)

## 2014-10-12 LAB — ABO/RH: ABO/RH(D): AB POS

## 2014-10-12 LAB — HIV ANTIBODY (ROUTINE TESTING W REFLEX): HIV SCREEN 4TH GENERATION: NONREACTIVE

## 2014-10-12 NOTE — Discharge Instructions (Signed)
Miscarriage A miscarriage is the sudden loss of an unborn baby (fetus) before the 20th week of pregnancy. Most miscarriages happen in the first 3 months of pregnancy. Sometimes, it happens before a woman even knows she is pregnant. A miscarriage is also called a "spontaneous miscarriage" or "early pregnancy loss." Having a miscarriage can be an emotional experience. Talk with your caregiver about any questions you may have about miscarrying, the grieving process, and your future pregnancy plans. CAUSES   Problems with the fetal chromosomes that make it impossible for the baby to develop normally. Problems with the baby's genes or chromosomes are most often the result of errors that occur, by chance, as the embryo divides and grows. The problems are not inherited from the parents.  Infection of the cervix or uterus.   Hormone problems.   Problems with the cervix, such as having an incompetent cervix. This is when the tissue in the cervix is not strong enough to hold the pregnancy.   Problems with the uterus, such as an abnormally shaped uterus, uterine fibroids, or congenital abnormalities.   Certain medical conditions.   Smoking, drinking alcohol, or taking illegal drugs.   Trauma.  Often, the cause of a miscarriage is unknown.  SYMPTOMS   Vaginal bleeding or spotting, with or without cramps or pain.  Pain or cramping in the abdomen or lower back.  Passing fluid, tissue, or blood clots from the vagina. DIAGNOSIS  Your caregiver will perform a physical exam. You may also have an ultrasound to confirm the miscarriage. Blood or urine tests may also be ordered. TREATMENT   Sometimes, treatment is not necessary if you naturally pass all the fetal tissue that was in the uterus. If some of the fetus or placenta remains in the body (incomplete miscarriage), tissue left behind may become infected and must be removed. Usually, a dilation and curettage (D and C) procedure is performed.  During a D and C procedure, the cervix is widened (dilated) and any remaining fetal or placental tissue is gently removed from the uterus.  Antibiotic medicines are prescribed if there is an infection. Other medicines may be given to reduce the size of the uterus (contract) if there is a lot of bleeding.  If you have Rh negative blood and your baby was Rh positive, you will need a Rh immunoglobulin shot. This shot will protect any future baby from having Rh blood problems in future pregnancies. HOME CARE INSTRUCTIONS   Your caregiver may order bed rest or may allow you to continue light activity. Resume activity as directed by your caregiver.  Have someone help with home and family responsibilities during this time.   Keep track of the number of sanitary pads you use each day and how soaked (saturated) they are. Write down this information.   Do not use tampons. Do not douche or have sexual intercourse until approved by your caregiver.   Only take over-the-counter or prescription medicines for pain or discomfort as directed by your caregiver.   Do not take aspirin. Aspirin can cause bleeding.   Keep all follow-up appointments with your caregiver.   If you or your partner have problems with grieving, talk to your caregiver or seek counseling to help cope with the pregnancy loss. Allow enough time to grieve before trying to get pregnant again.  SEEK IMMEDIATE MEDICAL CARE IF:   You have severe cramps or pain in your back or abdomen.  You have a fever.  You pass large blood clots (walnut-sized   or larger) ortissue from your vagina. Save any tissue for your caregiver to inspect.   Your bleeding increases.   You have a thick, bad-smelling vaginal discharge.  You become lightheaded, weak, or you faint.   You have chills.  MAKE SURE YOU:  Understand these instructions.  Will watch your condition.  Will get help right away if you are not doing well or get  worse. Document Released: 10/20/2000 Document Revised: 08/21/2012 Document Reviewed: 06/15/2011 ExitCare Patient Information 2015 [employer]. This information is not intended to replace advice given to you by your health care provider. Make sure you discuss any questions you have with your health care provider.  

## 2014-10-12 NOTE — MAU Note (Signed)
Slight bleeding and cramping last night and got worse 30 minutes ago. [facility].

## 2014-10-12 NOTE — MAU Provider Note (Signed)
History     CSN: [id number]  Arrival date and time: 10/11/14 2351   First Provider Initiated Contact with Patient 10/12/14 0040      Chief Complaint  Patient presents with  . Vaginal Bleeding   HPI Comments: LMP: end of April   Nicole Duarte is a 19 y.o. G1P0 at Unknown who presents today with vaginal bleeding. She states that she had some spotting yesterday, but then today it became much worse. She states that she has passed clots, and while here in the bathroom she passed some tissue. She reports minimal pain at this time.   Vaginal Bleeding The patient's primary symptoms include vaginal bleeding. This is a new problem. The current episode started yesterday. The problem has been gradually worsening. The pain is mild (2/10). She is pregnant. Associated symptoms include abdominal pain. Pertinent negatives include no constipation, diarrhea, dysuria, fever, frequency, nausea, urgency or vomiting. The vaginal discharge was bloody. The vaginal bleeding is heavier than menses. She has been passing clots. She has been passing tissue. Nothing aggravates the symptoms. She has tried nothing for the symptoms.    Past Medical History  Diagnosis Date  . OM (otitis media), recurrent     Recent bilat rupture of TM Feb 2013  . Diabetes mellitus without complication   . Chronic kidney disease     Stage I    History reviewed. No pertinent past surgical history.  Family History  Problem Relation Age of Onset  . Hyperlipidemia Mother   . Depression Mother   . Diabetes Paternal Uncle   . Diabetes Paternal Grandfather     History  Substance Use Topics  . Smoking status: Never Smoker   . Smokeless tobacco: Not on file  . Alcohol Use: 0.6 oz/week    1 Glasses of wine per week    Allergies: No Known Allergies  Prescriptions prior to admission  Medication Sig Dispense Refill Last Dose  . BD PEN NEEDLE NANO U/F 32G X 4 MM MISC   5 10/12/2014 at Unknown time  . Insulin Pen Needle 31G X 8  MM MISC Lantus solarstar Insulin pen needlesDx 250.00. Test CBG QAC, QHS 100 each 6 10/12/2014 at Unknown time  . Lancets MISC Test cbg as directed 100 each 6 10/12/2014 at Unknown time  . NOVOLOG FLEXPEN 100 UNIT/ML FlexPen Sliding scale with meals   10/12/2014 at Unknown time  . ONE TOUCH ULTRA TEST test strip USE AS DIRECTED TO TEST FOUR TIMES DAILY BEFORE MEALS AND AFTER MEALS 100 each 11 10/12/2014 at Unknown time  . SPRINTEC 28 0.25-35 MG-MCG tablet    10/12/2014 at Unknown time  . TRESIBA FLEXTOUCH 100 UNIT/ML SOPN Inject 14 Units into the skin at bedtime.    10/12/2014 at Unknown time  . losartan (COZAAR) 25 MG tablet Take 25 mg by mouth daily.   More than a month at Unknown time  . traZODone (DESYREL) 50 MG tablet Take 0.5-1 tablets (25-50 mg total) by mouth at bedtime as needed for sleep. 30 tablet 3     Review of Systems  Constitutional: Negative for fever.  Gastrointestinal: Positive for abdominal pain. Negative for nausea, vomiting, diarrhea and constipation.  Genitourinary: Positive for vaginal bleeding. Negative for dysuria, urgency and frequency.   Physical Exam   Blood pressure 138/78, pulse 95, temperature 99 F (37.2 C), temperature source Oral, resp. rate 16, last menstrual period 09/02/2014, SpO2 100 %.  Physical Exam  Nursing note and vitals reviewed. Constitutional: She is oriented to  person, place, and time. She appears well-developed and well-nourished. No distress.  Cardiovascular: Normal rate.   Respiratory: Effort normal.  GI: Soft. There is no tenderness. There is no rebound.  Genitourinary:   External: no lesion Vagina: moderate amount of BR blood seen  Cervix: pink, smooth, no CMT, Slightly dilated with tissue at the os. Tissue easily removed, and sent to path.  Uterus: NSSC Adnexa: NT   Neurological: She is alert and oriented to person, place, and time.  Skin: Skin is warm and dry.  Psychiatric: She has a normal mood and affect.   Results for orders placed or  performed during the hospital encounter of 10/11/14 (from the past 24 hour(s))  CBC     Status: None   Collection Time: 10/12/14 12:30 AM  Result Value Ref Range   WBC 5.3 4.0 - 10.5 K/uL   RBC 4.82 3.87 - 5.11 MIL/uL   Hemoglobin 13.9 12.0 - 15.0 g/dL   HCT 16.1 09.6 - 04.5 %   MCV 87.6 78.0 - 100.0 fL   MCH 28.8 26.0 - 34.0 pg   MCHC 32.9 30.0 - 36.0 g/dL   RDW 40.9 81.1 - 91.4 %   Platelets 254 150 - 400 K/uL  ABO/Rh     Status: None (Preliminary result)   Collection Time: 10/12/14 12:30 AM  Result Value Ref Range   ABO/RH(D) AB POS   hCG, quantitative, pregnancy     Status: Abnormal   Collection Time: 10/12/14 12:30 AM  Result Value Ref Range   hCG, Beta Chain, Quant, S 7484 (H) <5 mIU/mL  Wet prep, genital     Status: Abnormal   Collection Time: 10/12/14 12:50 AM  Result Value Ref Range   Yeast Wet Prep HPF POC NONE SEEN NONE SEEN   Trich, Wet Prep NONE SEEN NONE SEEN   Clue Cells Wet Prep HPF POC NONE SEEN NONE SEEN   WBC, Wet Prep HPF POC FEW (A) NONE SEEN   US Ob Comp Less 14 Wks  10/12/2014   CLINICAL DATA:  Vaginal bleeding in first-trimester pregnancy. Bleeding for 2 hours.  EXAM: OBSTETRIC <14 WK [place] AND TRANSVAGINAL OB US  TECHNIQUE: Both transabdominal and transvaginal ultrasound examinations were performed for complete evaluation of the gestation as well as the maternal uterus, adnexal regions, and pelvic cul-de-sac. Transvaginal technique was performed to assess early pregnancy.  COMPARISON:  None.  FINDINGS: Intrauterine gestational sac: Not present.  Yolk sac:  Not present.  Embryo:  Not present.  Maternal uterus/adnexae: The uterus is normal in size. The endometrium measures approximately 5 mm. Right ovary measures 2.9 x 1.5 x 2.0 cm and appears normal. The left ovary measures 1.5 x 2.3 x 1.1 cm and appears normal. No pelvic free fluid. No adnexal mass.  IMPRESSION: No intrauterine gestation. No findings to suggest ectopic pregnancy. Findings are consistent with  pregnancy of unknown location. The endometrium appears thin. Correlation with beta HCG, as well as trending of beta HCG and sonographic follow-up in 10-14 days recommended based on HCG results.   Electronically Signed   By: Rubye Oaks M.D.   On: 10/12/2014 02:23   US Ob Transvaginal  10/12/2014   CLINICAL DATA:  Vaginal bleeding in first-trimester pregnancy. Bleeding for 2 hours.  EXAM: OBSTETRIC <14 WK [place] AND TRANSVAGINAL OB US  TECHNIQUE: Both transabdominal and transvaginal ultrasound examinations were performed for complete evaluation of the gestation as well as the maternal uterus, adnexal regions, and pelvic cul-de-sac. Transvaginal technique was performed to  assess early pregnancy.  COMPARISON:  None.  FINDINGS: Intrauterine gestational sac: Not present.  Yolk sac:  Not present.  Embryo:  Not present.  Maternal uterus/adnexae: The uterus is normal in size. The endometrium measures approximately 5 mm. Right ovary measures 2.9 x 1.5 x 2.0 cm and appears normal. The left ovary measures 1.5 x 2.3 x 1.1 cm and appears normal. No pelvic free fluid. No adnexal mass.  IMPRESSION: No intrauterine gestation. No findings to suggest ectopic pregnancy. Findings are consistent with pregnancy of unknown location. The endometrium appears thin. Correlation with beta HCG, as well as trending of beta HCG and sonographic follow-up in 10-14 days recommended based on HCG results.   Electronically Signed   By: Rubye Oaks M.D.   On: 10/12/2014 02:23     MAU Course  Procedures  MDM   Assessment and Plan   1. SAB (spontaneous abortion)   2. Vaginal bleeding in pregnancy, first trimester    DC home Comfort measures reviewed  Bleeding precautions [id number]  Return to MAU as needed FU with the clinic in two weeks   Follow-up Information    Follow up with [facility].   Specialty:  Obstetrics and Gynecology   Why:  they will call you for an appointment    Contact information:   [address] [place] [address] [phone]        Tawnya Crook 10/12/2014, 2:30 AM

## 2014-10-14 LAB — GC/CHLAMYDIA PROBE AMP (~~LOC~~) NOT AT ARMC
Chlamydia: POSITIVE — AB
NEISSERIA GONORRHEA: POSITIVE — AB

## 2014-10-15 ENCOUNTER — Telehealth (HOSPITAL_COMMUNITY): Payer: Self-pay | Admitting: *Deleted

## 2014-10-15 DIAGNOSIS — A749 Chlamydial infection, unspecified: Secondary | ICD-10-CM

## 2014-10-15 MED ORDER — AZITHROMYCIN 500 MG PO TABS: ORAL_TABLET | ORAL | Status: DC

## 2014-10-15 NOTE — Telephone Encounter (Signed)
Telephone call to patient regarding positive GC and chlamydia cultures, patient notified.  Patient has not been treated.  Rx called in per protocol for treatment of her chlamydia.  [facility] for treatment for her GC.  Instructed patient to notify her partner for treatment and to abstain from sex for 7 days post treatment.

## 2014-10-24 ENCOUNTER — Ambulatory Visit (INDEPENDENT_AMBULATORY_CARE_PROVIDER_SITE_OTHER): Payer: 59 | Admitting: Obstetrics and Gynecology

## 2014-10-24 ENCOUNTER — Encounter: Payer: Self-pay | Admitting: Obstetrics and Gynecology

## 2014-10-24 VITALS — BP 142/89 | HR 95 | Temp 98.2°F | Ht 65.0 in | Wt 156.3 lb

## 2014-10-24 DIAGNOSIS — O039 Complete or unspecified spontaneous abortion without complication: Secondary | ICD-10-CM | POA: Diagnosis not present

## 2014-10-24 NOTE — Patient Instructions (Signed)
Etonogestrel implant What is this medicine? ETONOGESTREL (et oh noe JES trel) is a contraceptive (birth control) device. It is used to prevent pregnancy. It can be used for up to 3 years. This medicine may be used for other purposes; ask your health care provider or pharmacist if you have questions. COMMON BRAND NAME(S): Implanon, Nexplanon What should I tell my health care provider before I take this medicine? They need to know if you have any of these conditions: -abnormal vaginal bleeding -blood vessel disease or blood clots -cancer of the breast, cervix, or liver -depression -diabetes -gallbladder disease -headaches -heart disease or recent heart attack -high blood pressure -high cholesterol -kidney disease -liver disease -renal disease -seizures -tobacco smoker -an unusual or allergic reaction to etonogestrel, other hormones, anesthetics or antiseptics, medicines, foods, dyes, or preservatives -pregnant or trying to get pregnant -breast-feeding How should I use this medicine? This device is inserted just under the skin on the inner side of your upper arm by a health care professional. Talk to your pediatrician regarding the use of this medicine in children. Special care may be needed. Overdosage: If you think you've taken too much of this medicine contact a poison control center or emergency room at once. Overdosage: If you think you have taken too much of this medicine contact a poison control center or emergency room at once. NOTE: This medicine is only for you. Do not share this medicine with others. What if I miss a dose? This does not apply. What may interact with this medicine? Do not take this medicine with any of the following medications: -amprenavir -bosentan -fosamprenavir This medicine may also interact with the following medications: -barbiturate medicines for inducing sleep or treating seizures -certain medicines for fungal infections like ketoconazole and  itraconazole -griseofulvin -medicines to treat seizures like carbamazepine, felbamate, oxcarbazepine, phenytoin, topiramate -modafinil -phenylbutazone -rifampin -some medicines to treat HIV infection like atazanavir, indinavir, lopinavir, nelfinavir, tipranavir, ritonavir -St. John's wort This list may not describe all possible interactions. Give your health care provider a list of all the medicines, herbs, non-prescription drugs, or dietary supplements you use. Also tell them if you smoke, drink alcohol, or use illegal drugs. Some items may interact with your medicine. What should I watch for while using this medicine? This product does not protect you against HIV infection (AIDS) or other sexually transmitted diseases. You should be able to feel the implant by pressing your fingertips over the skin where it was inserted. Tell your doctor if you cannot feel the implant. What side effects may I notice from receiving this medicine? Side effects that you should report to your doctor or health care professional as soon as possible: -allergic reactions like skin rash, itching or hives, swelling of the face, lips, or tongue -breast lumps -changes in vision -confusion, trouble speaking or understanding -dark urine -depressed mood -general ill feeling or flu-like symptoms -light-colored stools -loss of appetite, nausea -right upper belly pain -severe headaches -severe pain, swelling, or tenderness in the abdomen -shortness of breath, chest pain, swelling in a leg -signs of pregnancy -sudden numbness or weakness of the face, arm or leg -trouble walking, dizziness, loss of balance or coordination -unusual vaginal bleeding, discharge -unusually weak or tired -yellowing of the eyes or skin Side effects that usually do not require medical attention (Report these to your doctor or health care professional if they continue or are bothersome.): -acne -breast pain -changes in  weight -cough -fever or chills -headache -irregular menstrual bleeding -itching, burning, and   vaginal discharge -pain or difficulty passing urine -sore throat This list may not describe all possible side effects. Call your doctor for medical advice about side effects. You may report side effects to FDA at 1-800-FDA-1088. Where should I keep my medicine? This drug is given in a hospital or clinic and will not be stored at home. NOTE: This sheet is a summary. It may not cover all possible information. If you have questions about this medicine, talk to your doctor, pharmacist, or health care provider.  2015, [employer]vier/[employer] Standard. (2011-11-01 15:37:45)  

## 2014-10-24 NOTE — Progress Notes (Signed)
Patient ID: Nicole Duarte, female   DOB: 10-Jan-1996, 19 y.o.   MRN: [id number] 19 yo G1P0010 presenting today as an MAU follow up for the evaluation of a spontaneous miscarriage. The patient was diagnosed with a SAP on 10/12/2014. She reports heavy vaginal bleeding for 7 days. Her bleeding is scant now. She denies any cramping or pelvic pain. This was not a planned pregnancy and patient is interested in contraception  Past Medical History  Diagnosis Date  . OM (otitis media), recurrent     Recent bilat rupture of TM Feb 2013  . Diabetes mellitus without complication   . Chronic kidney disease     Stage I   No past surgical history on file. Family History  Problem Relation Age of Onset  . Hyperlipidemia Mother   . Depression Mother   . Diabetes Paternal Uncle   . Diabetes Paternal Grandfather    History  Substance Use Topics  . Smoking status: Never Smoker   . Smokeless tobacco: Not on file  . Alcohol Use: 0.6 oz/week    1 Glasses of wine per week   ROS See pertinent in HPI  Blood pressure 142/89, pulse 95, temperature 98.2 F (36.8 C), temperature source Oral, height 5\' 5"  (1.651 m), weight 156 lb 4.8 oz (70.897 kg), last menstrual period 09/02/2014, unknown if currently breastfeeding.  GENERAL: Well-developed, well-nourished female in no acute distress.  NEURO- oriented x 3,     10/12/2014 ultrasound FINDINGS: Intrauterine gestational sac: Not present.  Yolk sac: Not present.  Embryo: Not present.  Maternal uterus/adnexae: The uterus is normal in size. The endometrium measures approximately 5 mm. Right ovary measures 2.9 x 1.5 x 2.0 cm and appears normal. The left ovary measures 1.5 x 2.3 x 1.1 cm and appears normal. No pelvic free fluid. No adnexal mass.  IMPRESSION: No intrauterine gestation. No findings to suggest ectopic pregnancy. Findings are consistent with pregnancy of unknown location. The endometrium appears thin. Correlation with beta HCG, as well  as trending of beta HCG and sonographic follow-up in 10-14 days recommended based on HCG results.   Electronically Signed  By: Rubye Oaks M.D.  On: 10/12/2014 02:23  A/P 19 yo here as MAU follow up for SAB follow up - repeat quant HCG today - Discussed birth control options giving HTN- patient opted for Nexplanon - Will have the patient return in 2 weeks for Nexplanon insertion after observing a downward trend in her quant HCG

## 2014-10-25 ENCOUNTER — Telehealth: Payer: Self-pay | Admitting: General Practice

## 2014-10-25 LAB — HCG, QUANTITATIVE, PREGNANCY: HCG, BETA CHAIN, QUANT, S: 26.5 m[IU]/mL

## 2014-10-25 NOTE — Telephone Encounter (Signed)
Called patient to inform her of bhcg results and need for follow up in one week. Patient verbalized understanding and states she can come next Thursday 6/23 @ 2pm. Patient had no questions

## 2014-10-31 ENCOUNTER — Other Ambulatory Visit: Payer: 59

## 2014-11-13 ENCOUNTER — Encounter: Payer: Self-pay | Admitting: Obstetrics and Gynecology

## 2014-11-13 ENCOUNTER — Ambulatory Visit (INDEPENDENT_AMBULATORY_CARE_PROVIDER_SITE_OTHER): Payer: 59 | Admitting: Obstetrics and Gynecology

## 2014-11-13 VITALS — BP 134/85 | HR 107 | Ht 65.0 in | Wt 153.4 lb

## 2014-11-13 DIAGNOSIS — Z3049 Encounter for surveillance of other contraceptives: Secondary | ICD-10-CM | POA: Diagnosis not present

## 2014-11-13 DIAGNOSIS — Z3202 Encounter for pregnancy test, result negative: Secondary | ICD-10-CM

## 2014-11-13 DIAGNOSIS — Z30017 Encounter for initial prescription of implantable subdermal contraceptive: Secondary | ICD-10-CM

## 2014-11-13 LAB — POCT PREGNANCY, URINE: Preg Test, Ur: NEGATIVE

## 2014-11-13 MED ORDER — ETONOGESTREL 68 MG ~~LOC~~ IMPL
68.0000 mg | DRUG_IMPLANT | Freq: Once | SUBCUTANEOUS | Status: AC
  Administered 2014-11-13: 68 mg via SUBCUTANEOUS

## 2014-11-13 NOTE — Progress Notes (Signed)
Patient ID: Nicole RainwaterKija Emonie Stamey, female   DOB: 1995/09/20, 19 y.o.   MRN: [id number]  19 yo G1P0010 presenting today for Nexplanon insertion.  Patient given informed consent, signed copy in the chart, time out was performed. Pregnancy test was negative. Appropriate time out taken.  Patient's left arm was prepped and draped in the usual sterile fashion.. The ruler used to measure and mark insertion area.  Pt was prepped with alcohol swab and then injected with 2 cc of 1% lidocaine with epinephrine.  Pt was prepped with betadine, Implanon removed form packaging,  Device confirmed in needle, then inserted full length of needle and withdrawn per handbook instructions.  Pt insertion site covered with a pressure dressing.   Minimal blood loss.  Pt tolerated the procedure well.  Patient advised to use condoms for the first 2 weeks following insertion and with every sexual encounter to prevent STD transmision. Also discussed to allow 6 month with Nexplanon before requesting its removal for irregular cycles

## 2015-04-25 ENCOUNTER — Emergency Department (HOSPITAL_COMMUNITY)
Admission: EM | Admit: 2015-04-25 | Discharge: 2015-04-25 | Disposition: A | Discharge status: Home / Self Care | Payer: 59 | Attending: Emergency Medicine | Admitting: Emergency Medicine

## 2015-04-25 ENCOUNTER — Emergency Department (HOSPITAL_COMMUNITY): Payer: 59

## 2015-04-25 ENCOUNTER — Encounter (HOSPITAL_COMMUNITY): Payer: Self-pay | Admitting: Emergency Medicine

## 2015-04-25 DIAGNOSIS — Z8669 Personal history of other diseases of the nervous system and sense organs: Secondary | ICD-10-CM | POA: Insufficient documentation

## 2015-04-25 DIAGNOSIS — S01512A Laceration without foreign body of oral cavity, initial encounter: Secondary | ICD-10-CM

## 2015-04-25 DIAGNOSIS — Y92410 Unspecified street and highway as the place of occurrence of the external cause: Secondary | ICD-10-CM | POA: Insufficient documentation

## 2015-04-25 DIAGNOSIS — Z794 Long term (current) use of insulin: Secondary | ICD-10-CM | POA: Insufficient documentation

## 2015-04-25 DIAGNOSIS — Y9389 Activity, other specified: Secondary | ICD-10-CM | POA: Diagnosis not present

## 2015-04-25 DIAGNOSIS — Z79899 Other long term (current) drug therapy: Secondary | ICD-10-CM | POA: Insufficient documentation

## 2015-04-25 DIAGNOSIS — N181 Chronic kidney disease, stage 1: Secondary | ICD-10-CM | POA: Diagnosis not present

## 2015-04-25 DIAGNOSIS — E119 Type 2 diabetes mellitus without complications: Secondary | ICD-10-CM | POA: Diagnosis not present

## 2015-04-25 DIAGNOSIS — S20212A Contusion of left front wall of thorax, initial encounter: Secondary | ICD-10-CM

## 2015-04-25 DIAGNOSIS — S0993XA Unspecified injury of face, initial encounter: Secondary | ICD-10-CM | POA: Diagnosis present

## 2015-04-25 DIAGNOSIS — Y998 Other external cause status: Secondary | ICD-10-CM | POA: Diagnosis not present

## 2015-04-25 LAB — CBG MONITORING, ED: GLUCOSE-CAPILLARY: 286 mg/dL — AB (ref 65–99)

## 2015-04-25 MED ORDER — LIDOCAINE VISCOUS 2 % MT SOLN: 10.0000 mL | OROMUCOSAL | Status: DC | PRN

## 2015-04-25 MED ORDER — LIDOCAINE VISCOUS 2 % MT SOLN
20.0000 mL | Freq: Once | OROMUCOSAL | Status: AC
  Administered 2015-04-25: 20 mL via OROMUCOSAL
  Filled 2015-04-25: qty 30

## 2015-04-25 MED ORDER — HYDROCODONE-ACETAMINOPHEN 5-325 MG PO TABS: ORAL_TABLET | ORAL | Status: DC

## 2015-04-25 MED ORDER — ACETAMINOPHEN 500 MG PO TABS
1000.0000 mg | ORAL_TABLET | Freq: Once | ORAL | Status: AC
  Administered 2015-04-25: 1000 mg via ORAL
  Filled 2015-04-25: qty 2

## 2015-04-25 MED ORDER — PROCHLORPERAZINE MALEATE 5 MG PO TABS
5.0000 mg | ORAL_TABLET | Freq: Once | ORAL | Status: AC
  Administered 2015-04-25: 5 mg via ORAL
  Filled 2015-04-25: qty 1
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
## 2015-04-28 ENCOUNTER — Ambulatory Visit (INDEPENDENT_AMBULATORY_CARE_PROVIDER_SITE_OTHER): Payer: 59 | Admitting: Family Medicine

## 2015-04-28 ENCOUNTER — Encounter: Payer: Self-pay | Admitting: Family Medicine

## 2015-04-28 VITALS — BP 100/78 | HR 68 | Temp 98.3°F | Resp 18 | Wt 153.0 lb

## 2015-04-28 DIAGNOSIS — M545 Low back pain, unspecified: Secondary | ICD-10-CM

## 2015-04-28 DIAGNOSIS — S20212D Contusion of left front wall of thorax, subsequent encounter: Secondary | ICD-10-CM

## 2015-04-28 MED ORDER — IBUPROFEN 600 MG PO TABS: 600.0000 mg | ORAL_TABLET | Freq: Three times a day (TID) | ORAL | Status: DC | PRN

## 2015-04-28 MED ORDER — CYCLOBENZAPRINE HCL 5 MG PO TABS: 5.0000 mg | ORAL_TABLET | Freq: Every day | ORAL | Status: DC

## 2015-04-28 NOTE — Patient Instructions (Signed)
Take ibuprofen with food If severe pain you can take 1/2 Tablet of the pain medication Flexeril at bedtime F/U 2 weeks
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
## 2015-04-29 ENCOUNTER — Encounter: Payer: Self-pay | Admitting: Family Medicine

## 2015-04-29 ENCOUNTER — Telehealth: Payer: Self-pay | Admitting: Family Medicine

## 2015-04-29 NOTE — Telephone Encounter (Signed)
To Katie

## 2015-04-29 NOTE — Telephone Encounter (Signed)
Okay to write note.

## 2015-04-29 NOTE — Telephone Encounter (Signed)
Pt is calling to request an out of work note for Wednesday &  Thursday due to her back pain. [phone]

## 2015-04-29 NOTE — Telephone Encounter (Signed)
To MD

## 2015-05-13 ENCOUNTER — Other Ambulatory Visit: Payer: Self-pay | Admitting: Family Medicine

## 2015-05-13 ENCOUNTER — Encounter: Payer: Self-pay | Admitting: Family Medicine

## 2015-05-13 ENCOUNTER — Ambulatory Visit (INDEPENDENT_AMBULATORY_CARE_PROVIDER_SITE_OTHER): Payer: 59 | Admitting: Family Medicine

## 2015-05-13 DIAGNOSIS — M545 Low back pain, unspecified: Secondary | ICD-10-CM

## 2015-05-13 DIAGNOSIS — J00 Acute nasopharyngitis [common cold]: Secondary | ICD-10-CM | POA: Diagnosis not present

## 2015-05-13 NOTE — Telephone Encounter (Signed)
Refill appropriate and filled per protocol. 

## 2015-05-13 NOTE — Patient Instructions (Signed)
Continue ibuprofen as needed  Get xray done  [address]  F/U pending results
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
## 2015-05-15 ENCOUNTER — Ambulatory Visit
Admission: RE | Admit: 2015-05-15 | Discharge: 2015-05-15 | Disposition: A | Discharge status: Home / Self Care | Payer: 59 | Source: Ambulatory Visit | Attending: Family Medicine | Admitting: Family Medicine

## 2015-05-15 DIAGNOSIS — M545 Low back pain, unspecified: Secondary | ICD-10-CM

## 2015-05-28 ENCOUNTER — Telehealth: Payer: Self-pay | Admitting: Family Medicine

## 2015-05-28 NOTE — Telephone Encounter (Signed)
I recommend that she schedule another follow-up appointment with Dr. Jeanice Lim to further evaluate and further treat.. I reviewed her chart including her x-ray----at this point, I do not think a back specialist is indicated/necessary.

## 2015-05-28 NOTE — Telephone Encounter (Signed)
Patient would like referral to a specialist for her back pain, she says it is not better after a month  [phone]

## 2015-05-28 NOTE — Telephone Encounter (Signed)
Call placed to patient. No answer. No VM.  
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
## 2015-05-29 NOTE — Telephone Encounter (Signed)
Call placed to patient. LMTRC.  

## 2015-05-30 NOTE — Telephone Encounter (Signed)
Multiple calls placed to patient with no answer and no return call.   Message to be closed.  

## 2015-07-28 ENCOUNTER — Ambulatory Visit (INDEPENDENT_AMBULATORY_CARE_PROVIDER_SITE_OTHER): Payer: 59 | Admitting: Physician Assistant

## 2015-07-28 VITALS — BP 112/68 | HR 82 | Temp 98.1°F | Resp 16 | Ht 65.0 in | Wt 162.0 lb

## 2015-07-28 DIAGNOSIS — R070 Pain in throat: Secondary | ICD-10-CM | POA: Diagnosis not present

## 2015-07-28 LAB — POCT RAPID STREP A (OFFICE): Rapid Strep A Screen: NEGATIVE

## 2015-07-28 MED ORDER — AMOXICILLIN 875 MG PO TABS: 875.0000 mg | ORAL_TABLET | Freq: Two times a day (BID) | ORAL | Status: AC

## 2015-07-28 NOTE — Patient Instructions (Addendum)
     IF you received an x-ray today, you will receive an invoice from [facility]. Please contact [facility] at [phone] with questions or concerns regarding your invoice.   IF you received labwork today, you will receive an invoice from [facility]. Please contact [facility] at [phone] with questions or concerns regarding your invoice.   Our billing staff will not be able to assist you with questions regarding bills from these companies.  You will be contacted with the lab results as soon as they are available. The fastest way to get your results is to activate your My Chart account. Instructions are located on the last page of this paperwork. If you have not heard from us regarding the results in 2 weeks, please contact this office.     Please take medication as prescribed. I would like to know if you have no improvement in the next week.   Please return sooner, if you are trouble breathing, uncontrolled fever, nausea, dizziness, or coughing up blood. I would like you to take tylenol for throat pain as needed.

## 2015-07-28 NOTE — Progress Notes (Signed)
[facility] [address] [place] [address] [phone] [phone]  Date:  07/28/2015   Name:  Nicole Duarte   DOB:  06-02-1995   MRN:  [id number]  PCP:  Milinda Antis, MD    History of Present Illness:  Nicole Duarte is a 20 y.o. female patient who presents to [facility] for chief complaint of throat pain for 1 week.  Patient states that her throat hurts when she swallows for 1 week. It also hurts to move, laugh, or cough. She denies any recent upper respiratory symptoms. She has no cough, nasal congestion, postnasal drip, or otalgia. There is no fever. She has no history of reflux, abdominal pain, nausea, or sour brash. She has no history of allergies.     Patient Active Problem List   Diagnosis Date Noted  . Insomnia 07/16/2014  . Depression 07/16/2014  . Fibrocystic breast changes 06/30/2012  . Type I diabetes mellitus, well controlled (HCC) 06/30/2012  . Candidal intertrigo 09/01/2011    Past Medical History  Diagnosis Date  . OM (otitis media), recurrent     Recent bilat rupture of TM Feb 2013  . Diabetes mellitus without complication (HCC)   . Chronic kidney disease     Stage I    History reviewed. No pertinent past surgical history.  Social History  Substance Use Topics  . Smoking status: Never Smoker   . Smokeless tobacco: None  . Alcohol Use: 0.6 oz/week    1 Glasses of wine per week    Family History  Problem Relation Age of Onset  . Hyperlipidemia Mother   . Depression Mother   . Diabetes Paternal Uncle   . Diabetes Paternal Grandfather     No Known Allergies  Medication list has been reviewed and updated.  Current Outpatient Prescriptions on File Prior to Visit  Medication Sig Dispense Refill  . losartan (COZAAR) 25 MG tablet Take 25 mg by mouth daily.    Marland Kitchen NOVOLOG FLEXPEN 100 UNIT/ML FlexPen Inject 1-10 Units into the skin 4 (four) times daily -  before meals and at bedtime. Sliding scale with meals    . TRESIBA FLEXTOUCH  100 UNIT/ML SOPN Inject 14 Units into the skin at bedtime.     . cyclobenzaprine (FLEXERIL) 5 MG tablet Take 1 tablet (5 mg total) by mouth at bedtime. (Patient not taking: Reported on 07/28/2015) 10 tablet 1  . ibuprofen (ADVIL,MOTRIN) 600 MG tablet TAKE 1 TABLET(600 MG) BY MOUTH EVERY 8 HOURS AS NEEDED (Patient not taking: Reported on 07/28/2015) 30 tablet 0   No current facility-administered medications on file prior to visit.    ROS ROS otherwise unremarkable unless listed above.  Physical Examination: BP 112/68 mmHg  Pulse 82  Temp(Src) 98.1 F (36.7 C) (Oral)  Resp 16  Ht  (1.651 m)  Wt 162 lb (73.483 kg)  BMI 26.96 kg/m2  SpO2 98% Ideal Body Weight: Weight in (lb) to have BMI = 25: 149.9  Physical Exam  Constitutional: She is oriented to person, place, and time. She appears well-developed and well-nourished. No distress.  HENT:  Head: Normocephalic and atraumatic.  Right Ear: External ear normal.  Left Ear: External ear normal.  Nose: No mucosal edema or rhinorrhea. Right sinus exhibits no maxillary sinus tenderness and no frontal sinus tenderness. Left sinus exhibits no maxillary sinus tenderness and no frontal sinus tenderness.  Mouth/Throat: No uvula swelling. No oropharyngeal exudate, posterior oropharyngeal edema or posterior oropharyngeal erythema.  Eyes: Conjunctivae and  EOM are normal. Pupils are equal, round, and reactive to light.  Neck: Full passive range of motion without pain. No rigidity. No edema, no erythema and normal range of motion present. No thyromegaly present.  Cardiovascular: Normal rate.   Pulmonary/Chest: Effort normal. No respiratory distress. She has no decreased breath sounds. She has no wheezes.  Lymphadenopathy:    She has no cervical adenopathy.  Neurological: She is alert and oriented to person, place, and time.  Skin: She is not diaphoretic.  Psychiatric: She has a normal mood and affect. Her behavior is normal.     Assessment and  Plan: Nicole Duarte is a 20 y.o. female who is here today for chief complaint of throat pain for 1 week. -Advised gargling with saltwater, as well as Tylenol for arthritic pain. This could be viral but will treat for bacterial etiology. Differential diagnosis would include reflux, allergies. Throat pain - Plan: POCT rapid strep A, amoxicillin (AMOXIL) 875 MG tablet, Culture, Group A Strep  Trena PlattStephanie Tkai Large, PA-C [facility] 07/28/2015 11:48 AM

## 2015-07-30 LAB — CULTURE, GROUP A STREP

## 2015-10-08 IMAGING — CT CT ABD-PELV W/ CM
2 of 4 series · 11 of 46 positions shown, 12 images · IV contrast (omnipaque)
Comparison: Abdomen film of 04/23/2014

CLINICAL DATA: Lower abdomen and back pain for 2 weeks radiating
towards umbilicus, nausea

EXAM:
CT ABDOMEN AND PELVIS WITH CONTRAST
TECHNIQUE: Multidetector CT imaging of the abdomen and pelvis was performed
using the standard protocol following bolus administration of
intravenous contrast.
CONTRAST:  100mL OMNIPAQUE IOHEXOL 300 MG/ML  SOLN

[Series 201: routine, idose (2) · axial · 0.94mm/px · z∈[-435,-60]mm · 8 of 91 slices shown, 9 images]
[im 8/91  soft-tissue]
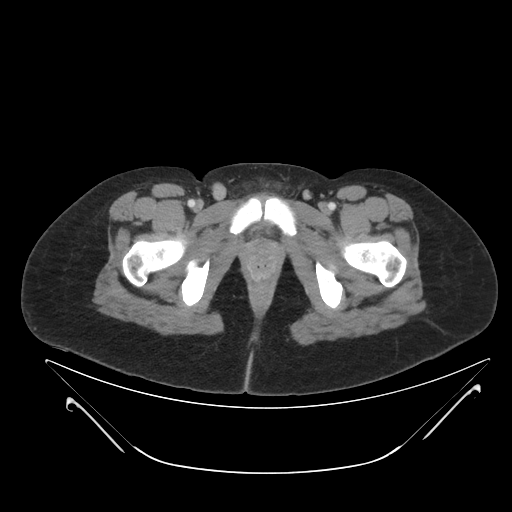
[im 8/91  bone]
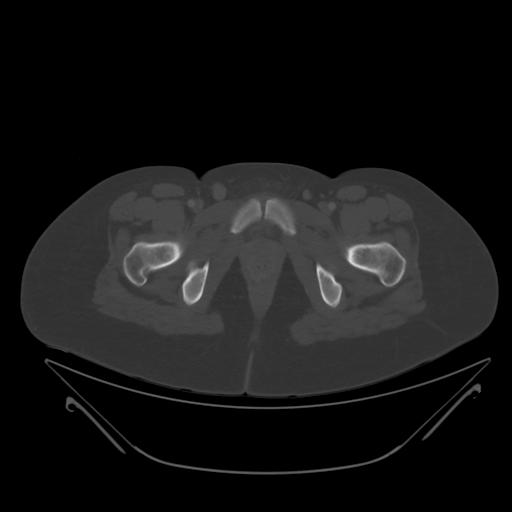
[im 20/91  soft-tissue]
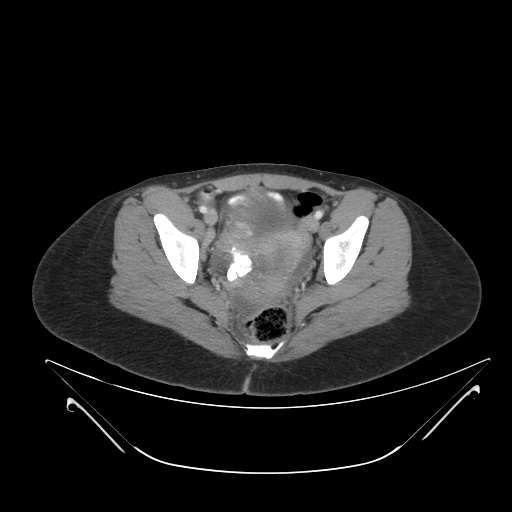
[im 28/91  soft-tissue]
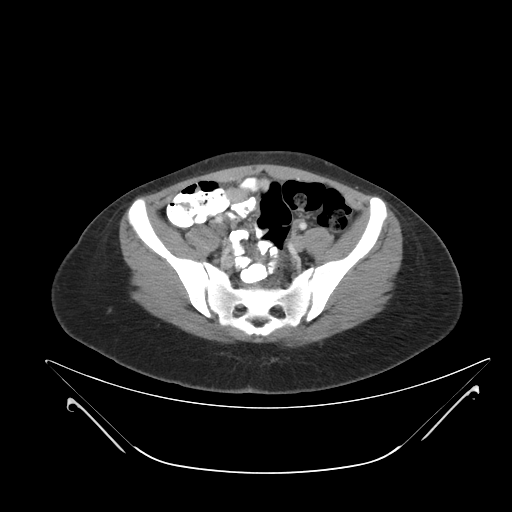
[im 40/91  soft-tissue]
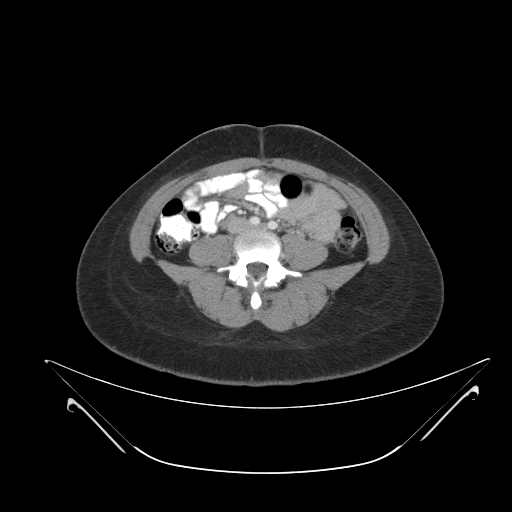
[im 51/91  soft-tissue]
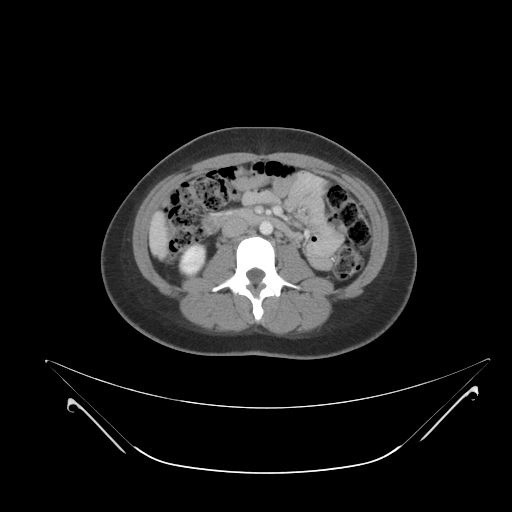
[im 63/91  soft-tissue]
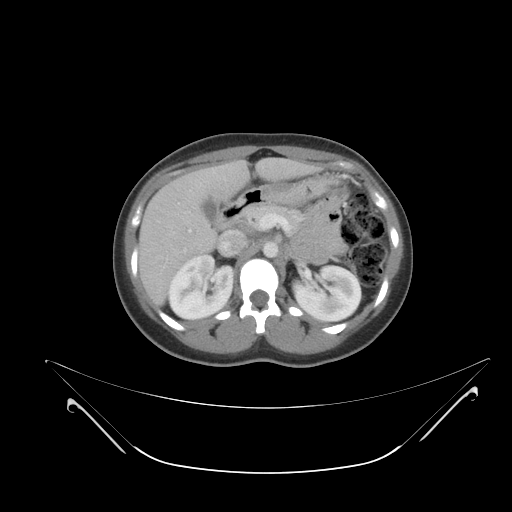
[im 71/91  soft-tissue]
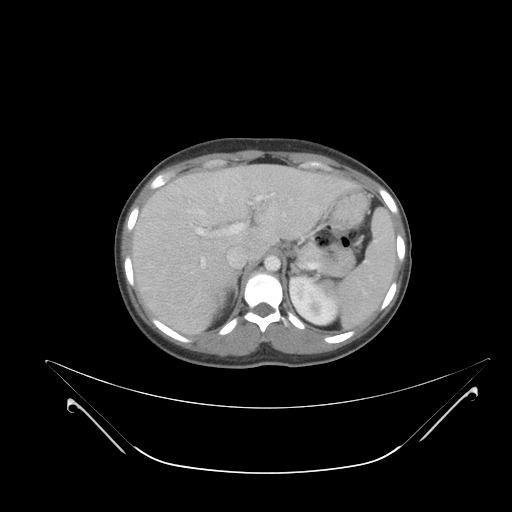
[im 83/91  soft-tissue]
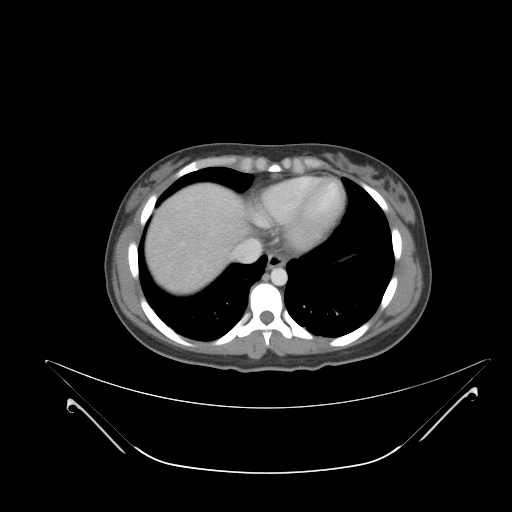

[Series 202: coronals, idose (2) · coronal · 0.50mm/px · 3 of 92 slices shown]
[im 31/92  soft-tissue]
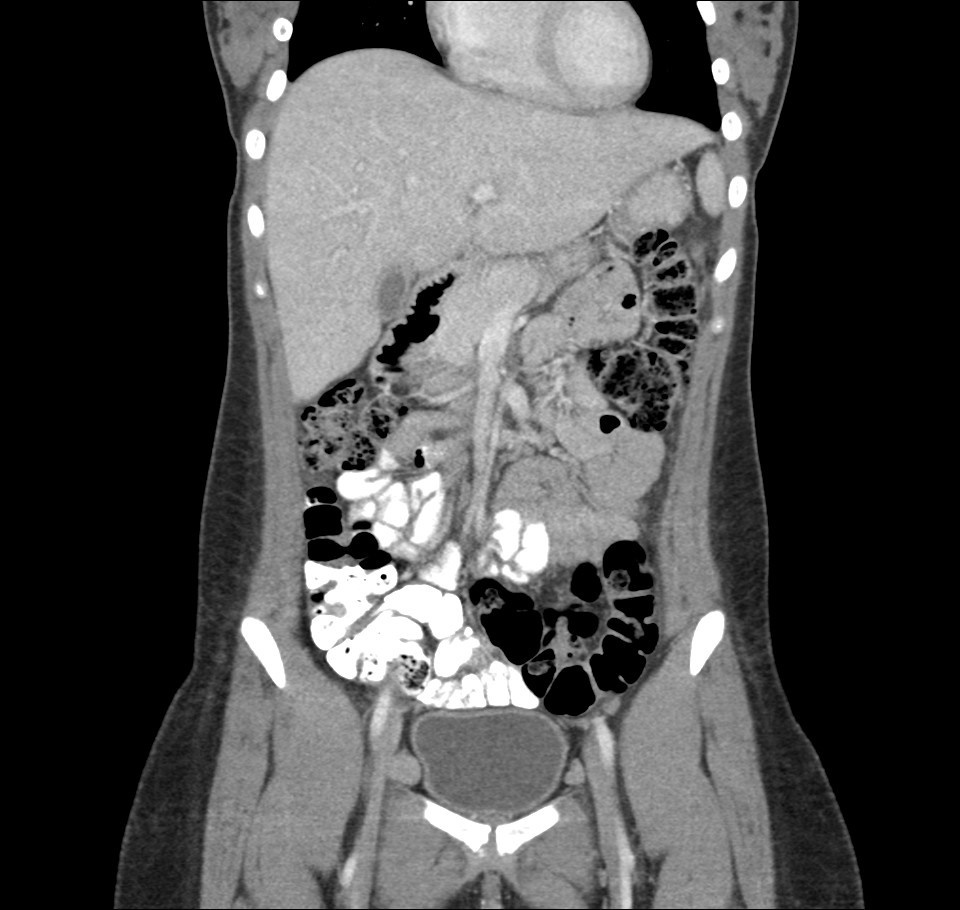
[im 41/92  soft-tissue]
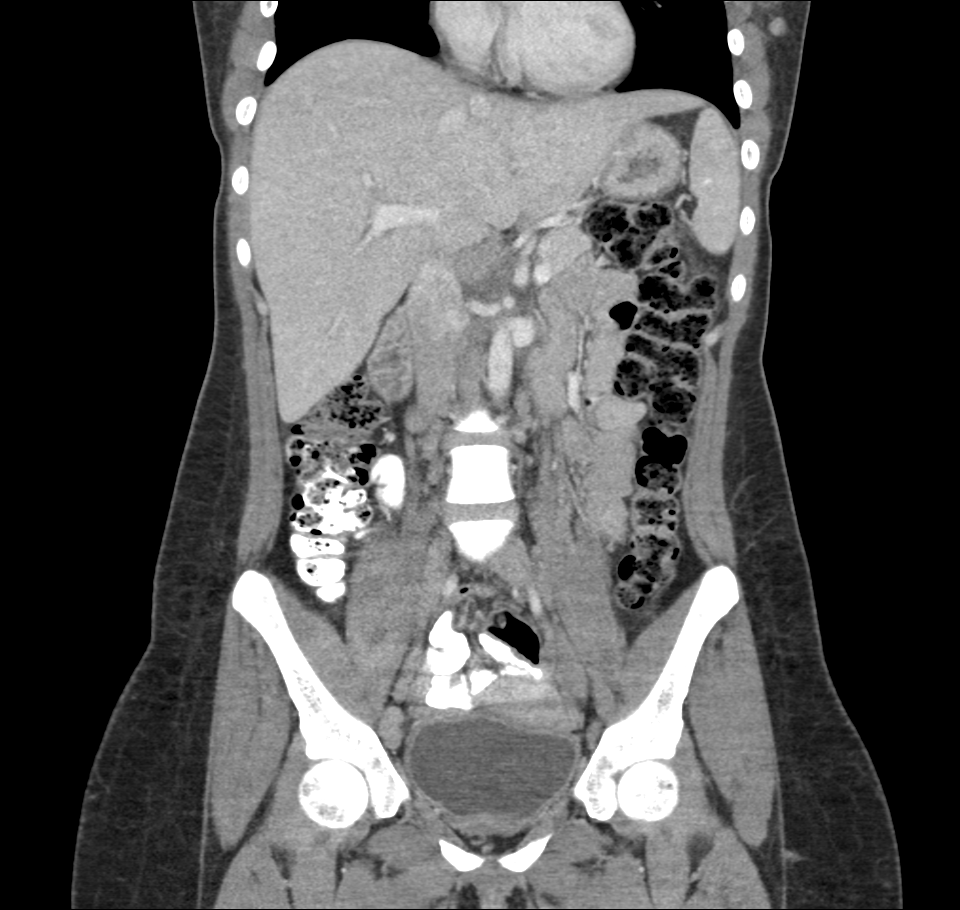
[im 51/92  soft-tissue]
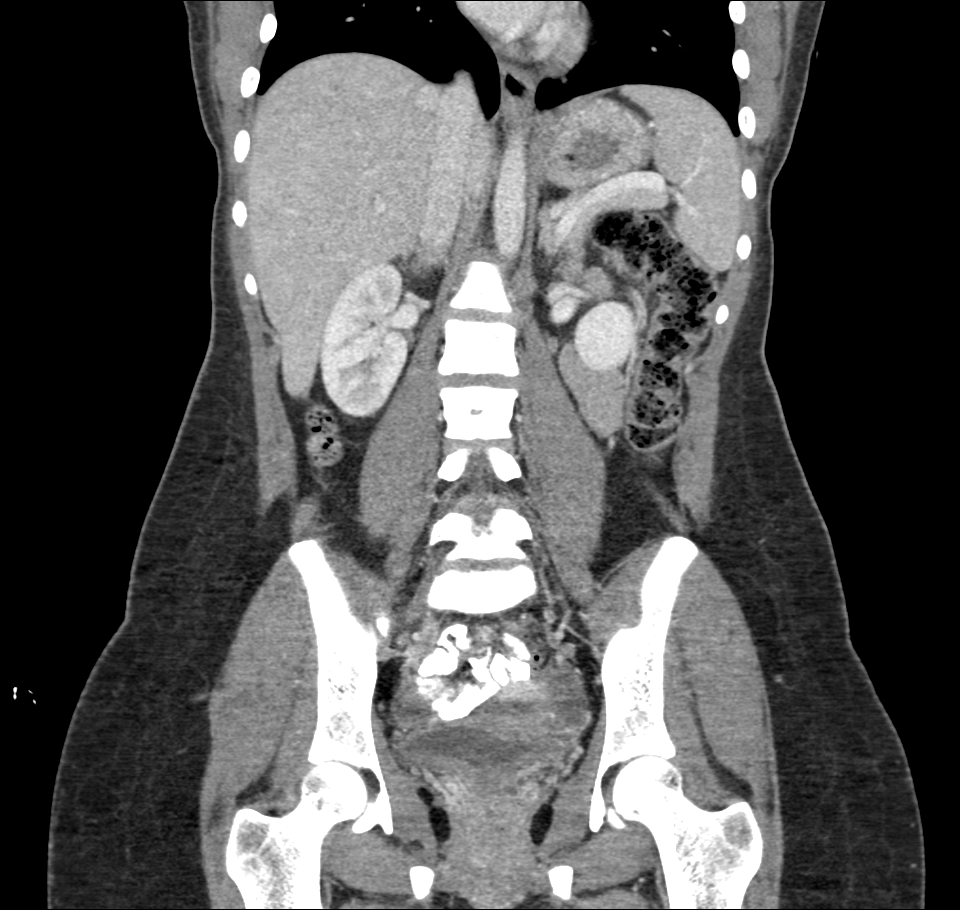

[11 of 46 positions shown; findings below may reference images not displayed]

FINDINGS: The lung bases are clear other than mild bibasilar dependent
atelectasis. If there are any clinical symptoms referable to
pneumonia, developing pneumonia at the lung bases would be difficult
to exclude and followup would be recommended.

The liver enhances with no focal abnormality and no ductal
dilatation is seen. No calcified gallstones are noted. The pancreas
is normal in size in the pancreatic duct is not dilated. The adrenal
glands and spleen are unremarkable. The stomach is decompressed. The
kidneys enhance with no calculus or mass and there is no evidence of
hydronephrosis. The abdominal aorta is normal in caliber. No
adenopathy is seen.

The uterus is slightly to the left midline. A small amount of free
fluid is noted in the pelvis. The urinary bladder is unremarkable.
No adnexal lesion is seen with probable small follicles present
bilaterally. There is a moderate amount of feces throughout the
colon. The terminal ileum and the appendix are unremarkable. The
lumbar vertebrae are in normal alignment with normal disc spaces.
IMPRESSION: 1. No definite explanation for the patient's pain is seen. There is
a small amount of free fluid in the pelvis and a ruptured ovarian
cyst cannot be excluded.
2. The appendix and terminal ileum are unremarkable.
3. Probable bibasilar dependent atelectasis. If there are any
clinical symptoms referable to developing pneumonia then followup
chest x-ray may be helpful.

## 2016-02-23 ENCOUNTER — Ambulatory Visit (INDEPENDENT_AMBULATORY_CARE_PROVIDER_SITE_OTHER): Payer: Self-pay | Admitting: Physician Assistant

## 2016-02-23 VITALS — BP 120/72 | HR 89 | Temp 98.1°F | Resp 16 | Ht 65.0 in | Wt 157.6 lb

## 2016-02-23 DIAGNOSIS — J029 Acute pharyngitis, unspecified: Secondary | ICD-10-CM | POA: Diagnosis not present

## 2016-02-23 LAB — POCT RAPID STREP A (OFFICE): RAPID STREP A SCREEN: NEGATIVE

## 2016-02-23 MED ORDER — OMEPRAZOLE 20 MG PO CPDR: 20.0000 mg | DELAYED_RELEASE_CAPSULE | Freq: Every day | ORAL | 1 refills | Status: DC

## 2016-02-23 NOTE — Patient Instructions (Addendum)
Try eating a bland diet with soft foods until pain resolves. Discontinue aspirin.  Use omeprazole daily for the next 4 weeks. Follow up at this time to see if we need to do a referral at this time or if symptoms have completely resolved.  In the meantime if you develop worsening of any of your symptoms, come back sooner.    Gastroesophageal Reflux Disease, Adult Normally, food travels down the esophagus and stays in the stomach to be digested. If a person has gastroesophageal reflux disease (GERD), food and stomach acid move back up into the esophagus. When this happens, the esophagus becomes sore and swollen (inflamed). Over time, GERD can make small holes (ulcers) in the lining of the esophagus. HOME CARE Diet  Follow a diet as told by your doctor. You may need to avoid foods and drinks such as:  Coffee and tea (with or without caffeine).  Drinks that contain alcohol.  Energy drinks and sports drinks.  Carbonated drinks or sodas.  Chocolate and cocoa.  Peppermint and mint flavorings.  Garlic and onions.  Horseradish.  Spicy and acidic foods, such as peppers, chili powder, curry powder, vinegar, hot sauces, and BBQ sauce.  Citrus fruit juices and citrus fruits, such as oranges, lemons, and limes.  Tomato-based foods, such as red sauce, chili, salsa, and pizza with red sauce.  Fried and fatty foods, such as donuts, french fries, potato chips, and high-fat dressings.  High-fat meats, such as hot dogs, rib eye steak, sausage, ham, and bacon.  High-fat dairy items, such as whole milk, butter, and cream cheese.  Eat small meals often. Avoid eating large meals.  Avoid drinking large amounts of liquid with your meals.  Avoid eating meals during the 2-3 hours before bedtime.  Avoid lying down right after you eat.  Do not exercise right after you eat. General Instructions  Pay attention to any changes in your symptoms.  Take over-the-counter and prescription medicines  only as told by your doctor. Do not take aspirin, ibuprofen, or other NSAIDs unless your doctor says it is okay.  Do not use any tobacco products, including cigarettes, chewing tobacco, and e-cigarettes. If you need help quitting, ask your doctor.  Wear loose clothes. Do not wear anything tight around your waist.  Raise (elevate) the head of your bed about 6 inches (15 cm).  Try to lower your stress. If you need help doing this, ask your doctor.  If you are overweight, lose an amount of weight that is healthy for you. Ask your doctor about a safe weight loss goal.  Keep all follow-up visits as told by your doctor. This is important. GET HELP IF:  You have new symptoms.  You lose weight and you do not know why it is happening.  You have trouble swallowing, or it hurts to swallow.  You have wheezing or a cough that keeps happening.  Your symptoms do not get better with treatment.  You have a hoarse voice. GET HELP RIGHT AWAY IF:  You have pain in your arms, neck, jaw, teeth, or back.  You feel sweaty, dizzy, or light-headed.  You have chest pain or shortness of breath.  You throw up (vomit) and your throw up looks like blood or coffee grounds.  You pass out (faint).  Your poop (stool) is bloody or black.  You cannot swallow, drink, or eat.   This information is not intended to replace advice given to you by your health care provider. Make sure you discuss any  questions you have with your health care provider.   Document Released: 10/13/2007 Document Revised: 01/15/2015 Document Reviewed: 08/21/2014 Elsevier Interactive Patient Education 2016 [employer].     IF you received an x-ray today, you will receive an invoice from [facility]. Please contact [facility] at [phone] with questions or concerns regarding your invoice.   IF you received labwork today, you will receive an invoice from [employer]. Please contact  [facility] at [phone] with questions or concerns regarding your invoice.   Our billing staff will not be able to assist you with questions regarding bills from these companies.  You will be contacted with the lab results as soon as they are available. The fastest way to get your results is to activate your My Chart account. Instructions are located on the last page of this paperwork. If you have not heard from [place] regarding the results in 2 weeks, please contact this office.

## 2016-02-23 NOTE — Progress Notes (Signed)
Nicole RainwaterKija Emonie Cannan  MRN: [id number] DOB: 03-Mar-1996  Subjective:  Nicole Duarte is a 20 y.o. female seen in office today for a chief complaint of sore throat x 3 days. Notes that when she is chewing food, her throat starts to burn and as she swallows it feels as if her throat is going to tighten up. The pain lingers for hours after she eats. It occurs with every type of food. She uses aspirin for relief. She has the same feeling when she swallows liquids. Denies dietary changes, heartburn, and belching. She is only eating popcorn right now.   In the past six months, she has had three incidents of this. She was seen in our office on 07/28/15 for same complaint and given amoxicillin. Pt notes that her symptoms resolved about three days into the antibiotic course.   Review of Systems  Constitutional: Negative for chills, fatigue and fever.  HENT: Positive for voice change (hoarse). Negative for congestion, ear pain, postnasal drip, rhinorrhea, sinus pressure, sneezing and sore throat.   Eyes: Negative for pain, discharge and itching.  Respiratory: Negative for cough, choking and shortness of breath.   Gastrointestinal: Negative for abdominal pain, nausea and vomiting.  Allergic/Immunologic: Negative for environmental allergies.    Patient Active Problem List   Diagnosis Date Noted  . Insomnia 07/16/2014  . Depression 07/16/2014  . Fibrocystic breast changes 06/30/2012  . Type I diabetes mellitus, well controlled (HCC) 06/30/2012  . Candidal intertrigo 09/01/2011    Current Outpatient Prescriptions on File Prior to Visit  Medication Sig Dispense Refill  . losartan (COZAAR) 25 MG tablet Take 25 mg by mouth daily.     No current facility-administered medications on file prior to visit.     No Known Allergies  Objective:  BP 120/72 (BP Location: Right Arm, Patient Position: Sitting, Cuff Size: Normal)   Pulse 89   Temp 98.1 F (36.7 C) (Oral)   Resp 16   Ht 5\' 5"  (1.651 m)    Wt 157 lb 9.6 oz (71.5 kg)   LMP 02/08/2016   SpO2 100%   BMI 26.23 kg/m   Physical Exam  Constitutional: She is oriented to person, place, and time and well-developed, well-nourished, and in no distress.  HENT:  Head: Normocephalic and atraumatic.  Right Ear: Tympanic membrane, external ear and ear canal normal.  Nose: Nose normal. Right sinus exhibits no maxillary sinus tenderness and no frontal sinus tenderness. Left sinus exhibits no maxillary sinus tenderness and no frontal sinus tenderness.  Mouth/Throat: Uvula is midline and mucous membranes are normal. Posterior oropharyngeal erythema present.  Eyes: Conjunctivae are normal.  Neck: Normal range of motion.  Cardiovascular: Normal rate, regular rhythm and normal heart sounds.   Pulmonary/Chest: Effort normal and breath sounds normal.  Abdominal: There is no tenderness.  Lymphadenopathy:       Head (right side): No submental, no submandibular, no tonsillar, no preauricular, no posterior auricular and no occipital adenopathy present.       Head (left side): No submental, no submandibular, no tonsillar, no preauricular, no posterior auricular and no occipital adenopathy present.    She has no cervical adenopathy.       Right: No supraclavicular adenopathy present.       Left: No supraclavicular adenopathy present.  Neurological: She is alert and oriented to person, place, and time. Gait normal.  Skin: Skin is warm and dry.  Psychiatric: Affect normal.  Vitals reviewed.   Results for orders placed or performed in  visit on 02/23/16 (from the past 24 hour(s))  POCT rapid strep A     Status: None   Collection Time: 02/23/16  2:05 PM  Result Value Ref Range   Rapid Strep A Screen Negative Negative   Assessment and Plan :   1. Sore throat -History and physical exam consistent with GERD, will treat according and have pt follow up closely. -Pt instructed to discontinue aspirin use and start eating bland soft food diet until pain  resolves. - POCT rapid strep A - Culture, Group A Strep - omeprazole (PRILOSEC) 20 MG capsule; Take 1 capsule (20 mg total) by mouth daily.  Dispense: 30 capsule; Refill: 1 -Follow up in 4 weeks, if no improvement at this time consider referral to ENT.   Benjiman Core PA-C  [facility] 02/23/2016 2:03 PM

## 2016-02-25 LAB — CULTURE, GROUP A STREP: ORGANISM ID, BACTERIA: NORMAL

## 2016-06-09 DIAGNOSIS — H6983 Other specified disorders of Eustachian tube, bilateral: Secondary | ICD-10-CM | POA: Diagnosis not present

## 2016-07-05 DIAGNOSIS — E1065 Type 1 diabetes mellitus with hyperglycemia: Secondary | ICD-10-CM | POA: Diagnosis not present

## 2016-07-05 DIAGNOSIS — E1022 Type 1 diabetes mellitus with diabetic chronic kidney disease: Secondary | ICD-10-CM | POA: Diagnosis not present

## 2016-07-05 DIAGNOSIS — N181 Chronic kidney disease, stage 1: Secondary | ICD-10-CM | POA: Diagnosis not present

## 2016-07-21 DIAGNOSIS — Z794 Long term (current) use of insulin: Secondary | ICD-10-CM | POA: Diagnosis not present

## 2016-07-21 DIAGNOSIS — E109 Type 1 diabetes mellitus without complications: Secondary | ICD-10-CM | POA: Diagnosis not present

## 2016-07-29 ENCOUNTER — Other Ambulatory Visit (HOSPITAL_COMMUNITY)
Admission: RE | Admit: 2016-07-29 | Discharge: 2016-07-29 | Disposition: A | Discharge status: Home / Self Care | Payer: 59 | Source: Ambulatory Visit | Attending: Obstetrics and Gynecology | Admitting: Obstetrics and Gynecology

## 2016-07-29 ENCOUNTER — Other Ambulatory Visit: Payer: Self-pay | Admitting: Nurse Practitioner

## 2016-07-29 DIAGNOSIS — Z01419 Encounter for gynecological examination (general) (routine) without abnormal findings: Secondary | ICD-10-CM | POA: Insufficient documentation

## 2016-07-30 LAB — CYTOLOGY - PAP: DIAGNOSIS: NEGATIVE

## 2016-09-16 DIAGNOSIS — E119 Type 2 diabetes mellitus without complications: Secondary | ICD-10-CM | POA: Diagnosis not present

## 2016-09-16 DIAGNOSIS — Z0000 Encounter for general adult medical examination without abnormal findings: Secondary | ICD-10-CM | POA: Diagnosis not present

## 2016-09-23 DIAGNOSIS — E119 Type 2 diabetes mellitus without complications: Secondary | ICD-10-CM | POA: Diagnosis not present

## 2016-09-23 DIAGNOSIS — Z1389 Encounter for screening for other disorder: Secondary | ICD-10-CM | POA: Diagnosis not present

## 2016-09-23 DIAGNOSIS — Z0000 Encounter for general adult medical examination without abnormal findings: Secondary | ICD-10-CM | POA: Diagnosis not present

## 2017-02-04 DIAGNOSIS — J3089 Other allergic rhinitis: Secondary | ICD-10-CM | POA: Diagnosis not present

## 2017-03-01 DIAGNOSIS — J209 Acute bronchitis, unspecified: Secondary | ICD-10-CM | POA: Diagnosis not present

## 2017-04-28 DIAGNOSIS — N760 Acute vaginitis: Secondary | ICD-10-CM | POA: Diagnosis not present

## 2017-06-07 DIAGNOSIS — J069 Acute upper respiratory infection, unspecified: Secondary | ICD-10-CM | POA: Diagnosis not present

## 2017-06-07 DIAGNOSIS — J209 Acute bronchitis, unspecified: Secondary | ICD-10-CM | POA: Diagnosis not present

## 2017-08-02 DIAGNOSIS — Z01419 Encounter for gynecological examination (general) (routine) without abnormal findings: Secondary | ICD-10-CM | POA: Diagnosis not present

## 2017-08-23 DIAGNOSIS — J309 Allergic rhinitis, unspecified: Secondary | ICD-10-CM | POA: Diagnosis not present

## 2017-08-23 DIAGNOSIS — J209 Acute bronchitis, unspecified: Secondary | ICD-10-CM | POA: Diagnosis not present

## 2017-09-13 DIAGNOSIS — Z79899 Other long term (current) drug therapy: Secondary | ICD-10-CM | POA: Diagnosis not present

## 2017-12-07 DIAGNOSIS — Z0000 Encounter for general adult medical examination without abnormal findings: Secondary | ICD-10-CM | POA: Diagnosis not present

## 2017-12-13 DIAGNOSIS — Z79899 Other long term (current) drug therapy: Secondary | ICD-10-CM | POA: Diagnosis not present

## 2017-12-21 DIAGNOSIS — E109 Type 1 diabetes mellitus without complications: Secondary | ICD-10-CM | POA: Diagnosis not present

## 2017-12-21 DIAGNOSIS — Z6827 Body mass index (BMI) 27.0-27.9, adult: Secondary | ICD-10-CM | POA: Diagnosis not present

## 2018-01-17 DIAGNOSIS — E119 Type 2 diabetes mellitus without complications: Secondary | ICD-10-CM | POA: Diagnosis not present

## 2018-01-17 DIAGNOSIS — Z6826 Body mass index (BMI) 26.0-26.9, adult: Secondary | ICD-10-CM | POA: Diagnosis not present

## 2018-03-13 DIAGNOSIS — J019 Acute sinusitis, unspecified: Secondary | ICD-10-CM | POA: Diagnosis not present

## 2018-03-13 DIAGNOSIS — E109 Type 1 diabetes mellitus without complications: Secondary | ICD-10-CM | POA: Diagnosis not present

## 2018-03-13 DIAGNOSIS — R05 Cough: Secondary | ICD-10-CM | POA: Diagnosis not present

## 2019-02-16 ENCOUNTER — Other Ambulatory Visit: Payer: Self-pay

## 2019-02-16 DIAGNOSIS — Z20822 Contact with and (suspected) exposure to covid-19: Secondary | ICD-10-CM

## 2019-02-17 LAB — NOVEL CORONAVIRUS, NAA: SARS-CoV-2, NAA: NOT DETECTED

## 2019-03-10 ENCOUNTER — Emergency Department (HOSPITAL_COMMUNITY)
Admission: EM | Admit: 2019-03-10 | Discharge: 2019-03-10 | Disposition: A | Discharge status: Home / Self Care | Payer: BC Managed Care – PPO | Attending: Emergency Medicine | Admitting: Emergency Medicine

## 2019-03-10 ENCOUNTER — Emergency Department (HOSPITAL_COMMUNITY): Payer: BC Managed Care – PPO

## 2019-03-10 ENCOUNTER — Other Ambulatory Visit: Payer: Self-pay

## 2019-03-10 ENCOUNTER — Encounter (HOSPITAL_COMMUNITY): Payer: Self-pay

## 2019-03-10 DIAGNOSIS — Y998 Other external cause status: Secondary | ICD-10-CM | POA: Insufficient documentation

## 2019-03-10 DIAGNOSIS — S022XXA Fracture of nasal bones, initial encounter for closed fracture: Secondary | ICD-10-CM | POA: Insufficient documentation

## 2019-03-10 DIAGNOSIS — R519 Headache, unspecified: Secondary | ICD-10-CM | POA: Insufficient documentation

## 2019-03-10 DIAGNOSIS — M542 Cervicalgia: Secondary | ICD-10-CM | POA: Insufficient documentation

## 2019-03-10 DIAGNOSIS — Y92415 Exit ramp or entrance ramp of street or highway as the place of occurrence of the external cause: Secondary | ICD-10-CM | POA: Diagnosis not present

## 2019-03-10 DIAGNOSIS — N181 Chronic kidney disease, stage 1: Secondary | ICD-10-CM | POA: Diagnosis not present

## 2019-03-10 DIAGNOSIS — Z79899 Other long term (current) drug therapy: Secondary | ICD-10-CM | POA: Diagnosis not present

## 2019-03-10 DIAGNOSIS — E1022 Type 1 diabetes mellitus with diabetic chronic kidney disease: Secondary | ICD-10-CM | POA: Insufficient documentation

## 2019-03-10 DIAGNOSIS — Y9389 Activity, other specified: Secondary | ICD-10-CM | POA: Insufficient documentation

## 2019-03-10 DIAGNOSIS — Z794 Long term (current) use of insulin: Secondary | ICD-10-CM | POA: Insufficient documentation

## 2019-03-10 DIAGNOSIS — S0993XA Unspecified injury of face, initial encounter: Secondary | ICD-10-CM | POA: Diagnosis present

## 2019-03-10 MED ORDER — HYDROCODONE-ACETAMINOPHEN 5-325 MG PO TABS: 1.0000 | ORAL_TABLET | ORAL | 0 refills | Status: DC | PRN

## 2019-03-10 MED ORDER — NAPROXEN 500 MG PO TABS
500.0000 mg | ORAL_TABLET | Freq: Once | ORAL | Status: AC
  Administered 2019-03-10: 500 mg via ORAL
  Filled 2019-03-10: qty 1
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
## 2019-03-10 NOTE — Discharge Instructions (Signed)
Apply ice to the nose to reduce swelling Take Ibuprofen or Tylenol for mild-moderate pain Take Norco for severe pain Please avoid blowing the nose hard  Follow up with ENT to make sure broken nose is healing appropriately

## 2019-03-12 ENCOUNTER — Encounter (HOSPITAL_BASED_OUTPATIENT_CLINIC_OR_DEPARTMENT_OTHER): Payer: Self-pay | Admitting: *Deleted

## 2019-03-12 ENCOUNTER — Emergency Department (HOSPITAL_BASED_OUTPATIENT_CLINIC_OR_DEPARTMENT_OTHER)
Admission: EM | Admit: 2019-03-12 | Discharge: 2019-03-12 | Disposition: A | Discharge status: Home / Self Care | Payer: BC Managed Care – PPO | Attending: Emergency Medicine | Admitting: Emergency Medicine

## 2019-03-12 ENCOUNTER — Emergency Department (HOSPITAL_BASED_OUTPATIENT_CLINIC_OR_DEPARTMENT_OTHER): Payer: BC Managed Care – PPO

## 2019-03-12 ENCOUNTER — Other Ambulatory Visit: Payer: Self-pay

## 2019-03-12 DIAGNOSIS — M25562 Pain in left knee: Secondary | ICD-10-CM | POA: Diagnosis not present

## 2019-03-12 DIAGNOSIS — K5900 Constipation, unspecified: Secondary | ICD-10-CM | POA: Insufficient documentation

## 2019-03-12 DIAGNOSIS — N181 Chronic kidney disease, stage 1: Secondary | ICD-10-CM | POA: Diagnosis not present

## 2019-03-12 DIAGNOSIS — R109 Unspecified abdominal pain: Secondary | ICD-10-CM | POA: Diagnosis present

## 2019-03-12 DIAGNOSIS — S301XXD Contusion of abdominal wall, subsequent encounter: Secondary | ICD-10-CM | POA: Diagnosis not present

## 2019-03-12 DIAGNOSIS — E1122 Type 2 diabetes mellitus with diabetic chronic kidney disease: Secondary | ICD-10-CM | POA: Insufficient documentation

## 2019-03-12 DIAGNOSIS — S301XXA Contusion of abdominal wall, initial encounter: Secondary | ICD-10-CM

## 2019-03-12 LAB — CBC WITH DIFFERENTIAL/PLATELET
Abs Immature Granulocytes: 0.01 10*3/uL (ref 0.00–0.07)
Basophils Absolute: 0 10*3/uL (ref 0.0–0.1)
Basophils Relative: 0 %
Eosinophils Absolute: 0 10*3/uL (ref 0.0–0.5)
Eosinophils Relative: 0 %
HCT: 45.1 % (ref 36.0–46.0)
Hemoglobin: 14 g/dL (ref 12.0–15.0)
Immature Granulocytes: 0 %
Lymphocytes Relative: 34 %
Lymphs Abs: 1.9 10*3/uL (ref 0.7–4.0)
MCH: 27.9 pg (ref 26.0–34.0)
MCHC: 31 g/dL (ref 30.0–36.0)
MCV: 89.8 fL (ref 80.0–100.0)
Monocytes Absolute: 0.3 10*3/uL (ref 0.1–1.0)
Monocytes Relative: 6 %
Neutro Abs: 3.3 10*3/uL (ref 1.7–7.7)
Neutrophils Relative %: 60 %
Platelets: 274 10*3/uL (ref 150–400)
RBC: 5.02 MIL/uL (ref 3.87–5.11)
RDW: 13.8 % (ref 11.5–15.5)
WBC: 5.6 10*3/uL (ref 4.0–10.5)
nRBC: 0 % (ref 0.0–0.2)

## 2019-03-12 LAB — COMPREHENSIVE METABOLIC PANEL
ALT: 16 U/L (ref 0–44)
AST: 15 U/L (ref 15–41)
Albumin: 4.6 g/dL (ref 3.5–5.0)
Alkaline Phosphatase: 93 U/L (ref 38–126)
Anion gap: 10 (ref 5–15)
BUN: 9 mg/dL (ref 6–20)
CO2: 26 mmol/L (ref 22–32)
Calcium: 9.5 mg/dL (ref 8.9–10.3)
Chloride: 103 mmol/L (ref 98–111)
Creatinine, Ser: 0.56 mg/dL (ref 0.44–1.00)
GFR calc Af Amer: >60 mL/min (ref >60)
GFR calc non Af Amer: >60 mL/min (ref >60)
Glucose, Bld: 213 mg/dL — ABNORMAL HIGH (ref 70–99)
Potassium: 3.7 mmol/L (ref 3.5–5.1)
Sodium: 139 mmol/L (ref 135–145)
Total Bilirubin: 0.7 mg/dL (ref 0.3–1.2)
Total Protein: 7.9 g/dL (ref 6.5–8.1)

## 2019-03-12 LAB — URINALYSIS, ROUTINE W REFLEX MICROSCOPIC
Bilirubin Urine: NEGATIVE
Glucose, UA: >=500 mg/dL — AB
Hgb urine dipstick: NEGATIVE
Ketones, ur: 15 mg/dL — AB
Leukocytes,Ua: NEGATIVE
Nitrite: NEGATIVE
Protein, ur: NEGATIVE mg/dL
Specific Gravity, Urine: 1.015 (ref 1.005–1.030)
pH: 5.5 (ref 5.0–8.0)

## 2019-03-12 LAB — URINALYSIS, MICROSCOPIC (REFLEX)

## 2019-03-12 LAB — PREGNANCY, URINE: Preg Test, Ur: NEGATIVE

## 2019-03-12 MED ORDER — IBUPROFEN 800 MG PO TABS: 800.0000 mg | ORAL_TABLET | Freq: Three times a day (TID) | ORAL | 0 refills | Status: DC

## 2019-03-12 MED ORDER — IOHEXOL 300 MG/ML  SOLN
100.0000 mL | Freq: Once | INTRAMUSCULAR | Status: AC | PRN
  Administered 2019-03-12: 100 mL via INTRAVENOUS

## 2019-03-12 MED ORDER — POLYETHYLENE GLYCOL 3350 17 G PO PACK: 17.0000 g | PACK | Freq: Every day | ORAL | 0 refills | Status: DC

## 2019-03-12 MED ORDER — SENNOSIDES-DOCUSATE SODIUM 8.6-50 MG PO TABS: 1.0000 | ORAL_TABLET | Freq: Every evening | ORAL | 0 refills | Status: DC | PRN

## 2019-03-12 MED FILL — SM CLEARLAX POWDER: 17 | 14 days supply | Qty: 238 | Fill #0

## 2019-03-12 MED FILL — SENNA PLUS 8.6-50 MG TABS: 8.6-50 | 30 days supply | Qty: 30 | Fill #0

## 2019-03-12 MED FILL — IBUPROFEN 800 MG TAB: 800 | 7 days supply | Qty: 21 | Fill #0
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
## 2019-03-12 NOTE — ED Notes (Signed)
Patient transported to CT 
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
## 2019-07-10 ENCOUNTER — Ambulatory Visit: Payer: 59

## 2019-10-06 ENCOUNTER — Other Ambulatory Visit: Payer: Self-pay

## 2019-10-06 ENCOUNTER — Emergency Department (HOSPITAL_COMMUNITY): Payer: BC Managed Care – PPO

## 2019-10-06 ENCOUNTER — Emergency Department (HOSPITAL_COMMUNITY)
Admission: EM | Admit: 2019-10-06 | Discharge: 2019-10-06 | Disposition: A | Discharge status: Home / Self Care | Payer: BC Managed Care – PPO | Attending: Emergency Medicine | Admitting: Emergency Medicine

## 2019-10-06 ENCOUNTER — Emergency Department (HOSPITAL_COMMUNITY)
Admission: EM | Admit: 2019-10-06 | Discharge: 2019-10-06 | Discharge status: Absent without leave | Payer: No Typology Code available for payment source

## 2019-10-06 DIAGNOSIS — Z9641 Presence of insulin pump (external) (internal): Secondary | ICD-10-CM | POA: Insufficient documentation

## 2019-10-06 DIAGNOSIS — K529 Noninfective gastroenteritis and colitis, unspecified: Secondary | ICD-10-CM

## 2019-10-06 DIAGNOSIS — N181 Chronic kidney disease, stage 1: Secondary | ICD-10-CM | POA: Diagnosis not present

## 2019-10-06 DIAGNOSIS — R197 Diarrhea, unspecified: Secondary | ICD-10-CM

## 2019-10-06 DIAGNOSIS — R509 Fever, unspecified: Secondary | ICD-10-CM | POA: Insufficient documentation

## 2019-10-06 DIAGNOSIS — R1084 Generalized abdominal pain: Secondary | ICD-10-CM | POA: Insufficient documentation

## 2019-10-06 DIAGNOSIS — R112 Nausea with vomiting, unspecified: Secondary | ICD-10-CM | POA: Diagnosis not present

## 2019-10-06 DIAGNOSIS — E1022 Type 1 diabetes mellitus with diabetic chronic kidney disease: Secondary | ICD-10-CM | POA: Insufficient documentation

## 2019-10-06 LAB — URINALYSIS, ROUTINE W REFLEX MICROSCOPIC
Bacteria, UA: NONE SEEN
Bilirubin Urine: NEGATIVE
Glucose, UA: >=500 mg/dL — AB
Hgb urine dipstick: NEGATIVE
Ketones, ur: 80 mg/dL — AB
Leukocytes,Ua: NEGATIVE
Nitrite: NEGATIVE
Protein, ur: NEGATIVE mg/dL
Specific Gravity, Urine: 1.031 — ABNORMAL HIGH (ref 1.005–1.030)
pH: 5 (ref 5.0–8.0)

## 2019-10-06 LAB — CBC
HCT: 47.6 % — ABNORMAL HIGH (ref 36.0–46.0)
Hemoglobin: 14.8 g/dL (ref 12.0–15.0)
MCH: 28 pg (ref 26.0–34.0)
MCHC: 31.1 g/dL (ref 30.0–36.0)
MCV: 90.2 fL (ref 80.0–100.0)
Platelets: 275 10*3/uL (ref 150–400)
RBC: 5.28 MIL/uL — ABNORMAL HIGH (ref 3.87–5.11)
RDW: 13.5 % (ref 11.5–15.5)
WBC: 5.2 10*3/uL (ref 4.0–10.5)
nRBC: 0 % (ref 0.0–0.2)

## 2019-10-06 LAB — CBG MONITORING, ED: Glucose-Capillary: 236 mg/dL — ABNORMAL HIGH (ref 70–99)

## 2019-10-06 LAB — COMPREHENSIVE METABOLIC PANEL
ALT: 17 U/L (ref 0–44)
AST: 13 U/L — ABNORMAL LOW (ref 15–41)
Albumin: 4.5 g/dL (ref 3.5–5.0)
Alkaline Phosphatase: 83 U/L (ref 38–126)
Anion gap: 13 (ref 5–15)
BUN: 14 mg/dL (ref 6–20)
CO2: 22 mmol/L (ref 22–32)
Calcium: 9.5 mg/dL (ref 8.9–10.3)
Chloride: 100 mmol/L (ref 98–111)
Creatinine, Ser: 0.52 mg/dL (ref 0.44–1.00)
GFR calc Af Amer: >60 mL/min (ref >60)
GFR calc non Af Amer: >60 mL/min (ref >60)
Glucose, Bld: 247 mg/dL — ABNORMAL HIGH (ref 70–99)
Potassium: 3.9 mmol/L (ref 3.5–5.1)
Sodium: 135 mmol/L (ref 135–145)
Total Bilirubin: 1.8 mg/dL — ABNORMAL HIGH (ref 0.3–1.2)
Total Protein: 7.6 g/dL (ref 6.5–8.1)

## 2019-10-06 LAB — BASIC METABOLIC PANEL
Anion gap: 14 (ref 5–15)
BUN: 14 mg/dL (ref 6–20)
CO2: 23 mmol/L (ref 22–32)
Calcium: 9.8 mg/dL (ref 8.9–10.3)
Chloride: 104 mmol/L (ref 98–111)
Creatinine, Ser: 0.59 mg/dL (ref 0.44–1.00)
GFR calc Af Amer: >60 mL/min (ref >60)
GFR calc non Af Amer: >60 mL/min (ref >60)
Glucose, Bld: 205 mg/dL — ABNORMAL HIGH (ref 70–99)
Potassium: 4.1 mmol/L (ref 3.5–5.1)
Sodium: 141 mmol/L (ref 135–145)

## 2019-10-06 LAB — LIPASE, BLOOD: Lipase: 15 U/L (ref 11–51)

## 2019-10-06 LAB — I-STAT BETA HCG BLOOD, ED (MC, WL, AP ONLY): I-stat hCG, quantitative: <5 m[IU]/mL (ref <5)

## 2019-10-06 MED ORDER — SODIUM CHLORIDE 0.9 % IV BOLUS
1000.0000 mL | Freq: Once | INTRAVENOUS | Status: AC
  Administered 2019-10-06: 1000 mL via INTRAVENOUS

## 2019-10-06 MED ORDER — SODIUM CHLORIDE 0.9% FLUSH: 3.0000 mL | Freq: Once | INTRAVENOUS | Status: DC

## 2019-10-06 MED ORDER — ONDANSETRON 4 MG PO TBDP: 4.0000 mg | ORAL_TABLET | Freq: Three times a day (TID) | ORAL | 0 refills | Status: DC | PRN

## 2019-10-06 MED ORDER — SODIUM CHLORIDE (PF) 0.9 % IJ SOLN
INTRAMUSCULAR | Status: AC
  Filled 2019-10-06: qty 50

## 2019-10-06 MED ORDER — DICYCLOMINE HCL 20 MG PO TABS: 20.0000 mg | ORAL_TABLET | Freq: Two times a day (BID) | ORAL | 0 refills | Status: DC

## 2019-10-06 MED ORDER — IOHEXOL 300 MG/ML  SOLN
100.0000 mL | Freq: Once | INTRAMUSCULAR | Status: AC | PRN
  Administered 2019-10-06: 100 mL via INTRAVENOUS

## 2019-10-06 MED ORDER — ONDANSETRON HCL 4 MG/2ML IJ SOLN
4.0000 mg | Freq: Once | INTRAMUSCULAR | Status: AC
  Administered 2019-10-06: 4 mg via INTRAVENOUS
  Filled 2019-10-06: qty 2

## 2019-10-06 NOTE — ED Notes (Signed)
Patient tolerating water   Unable to collect BMP, Joy, EMT will attempt.

## 2019-10-06 NOTE — ED Triage Notes (Signed)
Per patient she has been vomiting since this morning and concerned because she is diabetic. cbg 236 in triage.

## 2019-10-06 NOTE — ED Provider Notes (Signed)
[facility]-EMERGENCY DEPT Provider Note   CSN: [id number] Arrival date & time: 10/06/19  1524     History Chief Complaint  Patient presents with  . Nausea  . Emesis    Nicole Duarte is a 24 y.o. female with PMHx type 1 diabetes who presents to the ED today with complaint of gradual onset, constant, achy, lower abdominal pain (suprapubic/RLQ) that woke pt out of her sleep last night around 1 AM. Pt also complains of TNTC episodes of NBNB emesis as well as 1 episode of diarrhea. Pt reports she is a type 1 diabetic and was concerned because her sugars were slightly elevated in the 200's. Pt has an insulin pump and states it has been delivering insulin/has not been dislodged and she does not believe she has missed any doses. Pt denies any recent sick contacts. She is vaccinated against COVID 19. Pt reports she had a temp of 100.8 earlier this morning however on arrival to the ED normal temp at 98.6, denies taking any fever reducing medications. Pt denies chills, urinary sx, pelvic pain, vaginal discharge, or any other associated symptoms.   The history is provided by the patient and medical records.       Past Medical History:  Diagnosis Date  . Chronic kidney disease    Stage I  . Diabetes mellitus without complication (HCC)   . OM (otitis media), recurrent    Recent bilat rupture of TM Feb 2013    Patient Active Problem List   Diagnosis Date Noted  . Insomnia 07/16/2014  . Depression 07/16/2014  . Fibrocystic breast changes 06/30/2012  . Type I diabetes mellitus, well controlled (HCC) 06/30/2012  . Candidal intertrigo 09/01/2011    No past surgical history on file.   OB History    Gravida  1   Para      Term      Preterm      AB  1   Living  0     SAB  1   TAB      Ectopic      Multiple      Live Births              Family History  Problem Relation Age of Onset  . Hyperlipidemia Mother   . Depression Mother   .  Diabetes Paternal Uncle   . Diabetes Paternal Grandfather     Social History   Tobacco Use  . Smoking status: Never Smoker  . Smokeless tobacco: Never Used  Substance Use Topics  . Alcohol use: Yes    Alcohol/week: 1.0 standard drinks    Types: 1 Glasses of wine per week  . Drug use: No    Home Medications Prior to Admission medications   Medication Sig Start Date End Date Taking? Authorizing Provider  Insulin Human (INSULIN PUMP) SOLN Inject into the skin.   Yes [provider]  dicyclomine (BENTYL) 20 MG tablet Take 1 tablet (20 mg total) by mouth 2 (two) times daily. 10/06/19   Tanda Rockers, PA-C  HYDROcodone-acetaminophen (NORCO/VICODIN) 5-325 MG tablet Take 1 tablet by mouth every 4 (four) hours as needed. Patient not taking: Reported on 10/06/2019 03/10/19   Bethel Born, PA-C  ibuprofen (ADVIL) 800 MG tablet Take 1 tablet (800 mg total) by mouth 3 (three) times daily. Patient not taking: Reported on 10/06/2019 03/12/19   Maia Plan, MD  omeprazole (PRILOSEC) 20 MG capsule Take 1 capsule (20 mg total) by mouth  daily. Patient not taking: Reported on 10/06/2019 02/23/16   Tenna Delaine D, PA-C  ondansetron (ZOFRAN ODT) 4 MG disintegrating tablet Take 1 tablet (4 mg total) by mouth every 8 (eight) hours as needed for nausea or vomiting. 10/06/19   Alroy Bailiff, Librado Guandique, PA-C  polyethylene glycol (MIRALAX) 17 g packet Take 17 g by mouth daily. Patient not taking: Reported on 10/06/2019 03/12/19   Long, Wonda Olds, MD  senna-docusate (SENOKOT-S) 8.6-50 MG tablet Take 1 tablet by mouth at bedtime as needed for mild constipation or moderate constipation. Patient not taking: Reported on 10/06/2019 03/12/19   Long, Wonda Olds, MD    Allergies    Patient has no known allergies.  Review of Systems   Review of Systems  Constitutional: Positive for fever. Negative for chills.  Respiratory: Negative for shortness of breath.   Cardiovascular: Negative for chest pain.   Gastrointestinal: Positive for abdominal pain, diarrhea, nausea and vomiting.  Endocrine: Negative for polydipsia, polyphagia and polyuria.  Genitourinary: Negative for difficulty urinating, dysuria, flank pain, pelvic pain, vaginal bleeding and vaginal discharge.  All other systems reviewed and are negative.   Physical Exam Updated Vital Signs BP 139/90   Pulse 88   Temp 98.6 F (37 C)   Resp 16   Ht 5\' 5"  (1.651 m)   Wt 77.1 kg   LMP 08/21/2019   SpO2 99%   BMI 28.29 kg/m   Physical Exam Vitals and nursing note reviewed.  Constitutional:      Appearance: She is not ill-appearing or diaphoretic.  HENT:     Head: Normocephalic and atraumatic.  Eyes:     Conjunctiva/sclera: Conjunctivae normal.  Cardiovascular:     Rate and Rhythm: Regular rhythm. Tachycardia present.  Pulmonary:     Effort: Pulmonary effort is normal.     Breath sounds: Normal breath sounds.  Abdominal:     Palpations: Abdomen is soft.     Tenderness: There is abdominal tenderness. There is no right CVA tenderness, left CVA tenderness, guarding or rebound.     Comments: Soft, + perimbilical/RLQ abdominal TTP, +BS throughout, no r/g/r, neg murphy's, neg mcburney's, no CVA TTP  Musculoskeletal:     Cervical back: Neck supple.  Skin:    General: Skin is warm and dry.  Neurological:     Mental Status: She is alert.     ED Results / Procedures / Treatments   Labs (all labs ordered are listed, but only abnormal results are displayed) Labs Reviewed  COMPREHENSIVE METABOLIC PANEL - Abnormal; Notable for the following components:      Result Value   Glucose, Bld 247 (*)    AST 13 (*)    Total Bilirubin 1.8 (*)    All other components within normal limits  CBC - Abnormal; Notable for the following components:   RBC 5.28 (*)    HCT 47.6 (*)    All other components within normal limits  URINALYSIS, ROUTINE W REFLEX MICROSCOPIC - Abnormal; Notable for the following components:   Specific Gravity,  Urine 1.031 (*)    Glucose, UA >=500 (*)    Ketones, ur 80 (*)    All other components within normal limits  BASIC METABOLIC PANEL - Abnormal; Notable for the following components:   Glucose, Bld 205 (*)    All other components within normal limits  CBG MONITORING, ED - Abnormal; Notable for the following components:   Glucose-Capillary 236 (*)    All other components within normal limits  LIPASE, BLOOD  I-STAT BETA HCG BLOOD, ED ([facility], [facility], [facility] ONLY)    EKG None  Radiology CT Abdomen Pelvis W Contrast  Result Date: 10/06/2019 CLINICAL DATA:  Abdominal pain and vomiting EXAM: CT ABDOMEN AND PELVIS WITH CONTRAST TECHNIQUE: Multidetector CT imaging of the abdomen and pelvis was performed using the standard protocol following bolus administration of intravenous contrast. CONTRAST:  OMNIPAQUE IOHEXOL 300 MG/ML  SOLN COMPARISON:  March 12, 2019 FINDINGS: Lower chest: Lung bases are clear. Hepatobiliary: No focal liver lesions are evident. Gallbladder wall is not appreciably thickened. There is no biliary duct dilatation. Pancreas: No pancreatic mass or inflammatory focus. Spleen: No splenic lesions are evident. Adrenals/Urinary Tract: Adrenals bilaterally appear normal. Kidneys bilaterally show no evident mass or hydronephrosis on either side. There is no evident renal or ureteral calculus on either side. Urinary bladder is midline with wall thickness within normal limits. Stomach/Bowel: There is no appreciable bowel wall or mesenteric thickening. There is no evident bowel obstruction. The terminal ileum appears normal. There is no evident free air or portal venous air. Vascular/Lymphatic: There is no abdominal aortic aneurysm. No arterial vascular lesions are evident. Major venous structures appear patent. No adenopathy is evident in the abdomen or pelvis. Reproductive: Uterus is anteverted.  No evident pelvic mass. Other: Appendix appears normal. No abscess or ascites is evident in the abdomen  or pelvis. Musculoskeletal: No blastic or lytic bone lesions. No intramuscular or abdominal wall lesions evident. IMPRESSION: 1. A cause for patient's symptoms has not been established with this study. 2. No bowel wall thickening or bowel obstruction. No abscess in the abdomen or pelvis. Appendix appears normal. 3. No renal or ureteral calculus. No hydronephrosis. Urinary bladder wall thickness within normal limits. Electronically Signed   By: Bretta Bang III M.D.   On: 10/06/2019 19:00    Procedures Procedures (including critical care time)  Medications Ordered in ED Medications  sodium chloride flush (NS) 0.9 % injection 3 mL (has no administration in time range)  sodium chloride (PF) 0.9 % injection (has no administration in time range)  sodium chloride 0.9 % bolus 1,000 mL (0 mLs Intravenous Stopped 10/06/19 2049)  ondansetron (ZOFRAN) injection 4 mg (4 mg Intravenous Given 10/06/19 1822)  iohexol (OMNIPAQUE) 300 MG/ML solution 100 mL (100 mLs Intravenous Contrast Given 10/06/19 1841)    ED Course  I have reviewed the triage vital signs and the nursing notes.  Pertinent labs & imaging results that were available during my care of the patient were reviewed by me and considered in my medical decision making (see chart for details).    MDM Rules/Calculators/A&P                      24 year old female presents to the ED today with complaint of lower abdominal pain as well as nausea, multiple episodes of emesis nonbloody and nonbilious as well as diarrhea.  Orts she had a temperature of 100.8 prior to arrival however in the ED patient is afebrile 98.6.  On arrival to the ED patient is nontachycardic and nontachypneic however during my exam patient is mildly tachycardic in the low 100s.  She has periumbilical as well as right lower quadrant abdominal tenderness to palpation.  She does have her insulin pump in place and denies any missed doses of insulin or dislodgment of her pump for several  hours.  Work was obtained while patient was in the waiting room -glucose elevated at 247.  Bicarb 22 and no gap.  Not  consistent with DKA at this time. Remainder of lab work unremarkable, UA without any signs of infection.  No leukocytosis however given fever and abdominal tenderness will obtain CT scan at this time.  Will provide fluids which will decrease glucose as well.   CT scan IMPRESSION:  1. A cause for patient's symptoms has not been established with this  study.    2. No bowel wall thickening or bowel obstruction. No abscess in the  abdomen or pelvis. Appendix appears normal.    3. No renal or ureteral calculus. No hydronephrosis. Urinary bladder  wall thickness within normal limits.   On reevaluation pt reports her symptoms have improved. Will repeat BMP to ensure no signs of DKA and fluid challenge pt. If able to tolerate fluids pt to be discharged home.   Repeat BMP with normal bicarb and no gap. Pt to be discharged home at this time with bentyl and zofran. Advised to follow up with PCP. Strict return precautions have been discussed. Pt is in agreement with plan and stable for discharge home.   This note was prepared using Dragon voice recognition software and may include unintentional dictation errors due to the inherent limitations of voice recognition software.  Final Clinical Impression(s) / ED Diagnoses Final diagnoses:  Generalized abdominal pain  Nausea vomiting and diarrhea  Gastroenteritis    Rx / DC Orders ED Discharge Orders         Ordered    ondansetron (ZOFRAN ODT) 4 MG disintegrating tablet  Every 8 hours PRN     10/06/19 2156    dicyclomine (BENTYL) 20 MG tablet  2 times daily     10/06/19 2156           Discharge Instructions     Your labwork and imaging were reassuring today. Your symptoms may be related to viral gastroenteritis (stomach bug). Please pick up medications and take as needed. I would recommend a bland diet and to increase your  water intake to stay hydrated.   Follow up with your PCP on Monday regarding your ED visit today.   Return to the ED for any worsening symptoms including worsening pain, excessive vomiting uncontrolled by medications, fevers > 100.4, shortness of breath, dizziness/lightheadedness, or any other new/concerning symptoms       Tanda Rockers, PA-C 10/06/19 2200    Lorre Nick, MD 10/07/19 [phone]

## 2019-10-06 NOTE — Discharge Instructions (Addendum)
Your labwork and imaging were reassuring today. Your symptoms may be related to viral gastroenteritis (stomach bug). Please pick up medications and take as needed. I would recommend a bland diet and to increase your water intake to stay hydrated.   Follow up with your PCP on Monday regarding your ED visit today.   Return to the ED for any worsening symptoms including worsening pain, excessive vomiting uncontrolled by medications, fevers > 100.4, shortness of breath, dizziness/lightheadedness, or any other new/concerning symptoms

## 2020-08-07 ENCOUNTER — Other Ambulatory Visit: Payer: Self-pay

## 2020-08-07 ENCOUNTER — Encounter: Payer: Self-pay | Admitting: Emergency Medicine

## 2020-08-07 ENCOUNTER — Ambulatory Visit
Admission: EM | Admit: 2020-08-07 | Discharge: 2020-08-07 | Disposition: A | Discharge status: Home / Self Care | Payer: BC Managed Care – PPO | Attending: Internal Medicine | Admitting: Internal Medicine

## 2020-08-07 DIAGNOSIS — J069 Acute upper respiratory infection, unspecified: Secondary | ICD-10-CM

## 2020-08-07 LAB — POCT INFLUENZA A/B
Influenza A, POC: NEGATIVE
Influenza B, POC: NEGATIVE

## 2020-08-07 MED ORDER — ALBUTEROL SULFATE HFA 108 (90 BASE) MCG/ACT IN AERS: 2.0000 | INHALATION_SPRAY | RESPIRATORY_TRACT | 0 refills | Status: DC | PRN

## 2020-08-07 NOTE — ED Triage Notes (Signed)
Dry cough that started Sunday and chest tightness that started x 2 days ago.

## 2020-08-07 NOTE — ED Provider Notes (Signed)
[facility]    CSN: [id number] Arrival date & time: 08/07/20  1422      History   Chief Complaint Chief Complaint  Patient presents with  . Cough    HPI Nicole Duarte is a 25 y.o. female who presents with dry cough x 5 days and noticed wheezing and chest  Tightness x 2 days. Usuallygets albuterol when she gets this way. Has allergies and is taking her allergy med qd. Denies body aches or GI symptoms. Has had a HA and low grade temp.      Past Medical History:  Diagnosis Date  . Chronic kidney disease    Stage I  . Diabetes mellitus without complication (HCC)   . OM (otitis media), recurrent    Recent bilat rupture of TM Feb 2013    Patient Active Problem List   Diagnosis Date Noted  . Insomnia 07/16/2014  . Depression 07/16/2014  . Fibrocystic breast changes 06/30/2012  . Type I diabetes mellitus, well controlled (HCC) 06/30/2012  . Candidal intertrigo 09/01/2011    History reviewed. No pertinent surgical history.  OB History    Gravida  1   Para      Term      Preterm      AB  1   Living  0     SAB  1   IAB      Ectopic      Multiple      Live Births               Home Medications    Prior to Admission medications   Medication Sig Start Date End Date Taking? Authorizing Provider  albuterol (VENTOLIN HFA) 108 (90 Base) MCG/ACT inhaler Inhale 2 puffs into the lungs every 4 (four) hours as needed for wheezing or shortness of breath. 08/07/20  Yes Rodriguez-Southworth, Nettie Elm, PA-C  dicyclomine (BENTYL) 20 MG tablet Take 1 tablet (20 mg total) by mouth 2 (two) times daily. 10/06/19   Hyman Hopes, Margaux, PA-C  ibuprofen (ADVIL) 800 MG tablet Take 1 tablet (800 mg total) by mouth 3 (three) times daily. Patient not taking: Reported on 10/06/2019 03/12/19   Long, Arlyss Repress, MD  Insulin Human (INSULIN PUMP) SOLN Inject into the skin.    [provider]  ondansetron (ZOFRAN ODT) 4 MG disintegrating tablet Take 1 tablet (4 mg  total) by mouth every 8 (eight) hours as needed for nausea or vomiting. 10/06/19   Hyman Hopes, Margaux, PA-C  omeprazole (PRILOSEC) 20 MG capsule Take 1 capsule (20 mg total) by mouth daily. Patient not taking: Reported on 10/06/2019 02/23/16 08/07/20  Magdalene River, PA-C    Family History Family History  Problem Relation Age of Onset  . Hyperlipidemia Mother   . Depression Mother   . Diabetes Paternal Uncle   . Diabetes Paternal Grandfather     Social History Social History   Tobacco Use  . Smoking status: Never Smoker  . Smokeless tobacco: Never Used  Substance Use Topics  . Alcohol use: Yes    Alcohol/week: 1.0 standard drink    Types: 1 Glasses of wine per week  . Drug use: No     Allergies   Patient has no known allergies.   Review of Systems Review of Systems  Constitutional: Positive for chills and fever. Negative for appetite change, diaphoresis and fatigue.  HENT: Positive for congestion, postnasal drip and rhinorrhea. Negative for ear discharge, ear pain, sore throat and trouble swallowing.   Eyes:  Negative for discharge.  Respiratory: Positive for cough and chest tightness. Negative for shortness of breath.   Cardiovascular: Negative for chest pain.  Gastrointestinal: Negative for diarrhea, nausea and vomiting.  Musculoskeletal: Negative for myalgias.  Neurological: Positive for headaches.  Hematological: Negative for adenopathy.     Physical Exam Triage Vital Signs ED Triage Vitals  Enc Vitals Group     BP 08/07/20 1449 134/83     Pulse Rate 08/07/20 1449 98     Resp 08/07/20 1449 18     Temp 08/07/20 1449 99.5 F (37.5 C)     Temp Source 08/07/20 1449 Oral     SpO2 08/07/20 1449 98 %     Weight --      Height --      Head Circumference --      Peak Flow --      Pain Score 08/07/20 1447 0     Pain Loc --      Pain Edu? --      Excl. in GC? --    No data found.  Updated Vital Signs BP 134/83 (BP Location: Right Arm)   Pulse 98   Temp  99.5 F (37.5 C) (Oral)   Resp 18   LMP 07/12/2020   SpO2 98%   Visual Acuity Right Eye Distance:   Left Eye Distance:   Bilateral Distance:    Right Eye Near:   Left Eye Near:    Bilateral Near:     Physical Exam Physical Exam Vitals signs and nursing note reviewed.  Constitutional:      General: She is not in acute distress.    Appearance: Normal appearance. She is not ill-appearing, toxic-appearing or diaphoretic.  HENT:     Head: Normocephalic.     Right Ear: Tympanic membrane, ear canal and external ear normal.     Left Ear: Tympanic membrane, ear canal and external ear normal.     Nose: Nose normal.     Mouth/Throat:     Mouth: Mucous membranes are moist.  Eyes:     General: No scleral icterus.       Right eye: No discharge.        Left eye: No discharge.     Conjunctiva/sclera: Conjunctivae normal.  Neck:     Musculoskeletal: Neck supple. No neck rigidity.  Cardiovascular:     Rate and Rhythm: Normal rate and regular rhythm.     Heart sounds: No murmur.  Pulmonary:     Effort: Pulmonary effort is normal.     Breath sounds: Normal breath sounds.    Musculoskeletal: Normal range of motion.  Lymphadenopathy:     Cervical: No cervical adenopathy.  Skin:    General: Skin is warm and dry.     Coloration: Skin is not jaundiced.     Findings: No rash.  Neurological:     Mental Status: She is alert and oriented to person, place, and time.     Gait: Gait normal.  Psychiatric:        Mood and Affect: Mood normal.        Behavior: Behavior normal.        Thought Content: Thought content normal.        Judgment: Judgment normal.     UC Treatments / Results  Labs (all labs ordered are listed, but only abnormal results are displayed) Labs Reviewed  POCT INFLUENZA A/B  Flu test neg  EKG   Radiology No results found.  Procedures Procedures (  including critical care time)  Medications Ordered in UC Medications - No data to display  Initial Impression  / Assessment and Plan / UC Course  I have reviewed the triage vital signs and the nursing notes. Pertinent labs  results that were available during my care of the patient were reviewed by me and considered in my medical decision making (see chart for details). I refilled her Albuterol inhaler, and will take it prn.   Final Clinical Impressions(s) / UC Diagnoses   Final diagnoses:  Viral upper respiratory infection     Discharge Instructions     Come back if you get worse in the next week, with fevers of 101 or more.     ED Prescriptions    Medication Sig Dispense Auth. Provider   albuterol (VENTOLIN HFA) 108 (90 Base) MCG/ACT inhaler Inhale 2 puffs into the lungs every 4 (four) hours as needed for wheezing or shortness of breath. 18 g Rodriguez-Southworth, Nettie Elm, PA-C     PDMP not reviewed this encounter.   Garey Ham, PA-C 08/07/20 1553

## 2020-08-07 NOTE — Discharge Instructions (Signed)
Come back if you get worse in the next week, with fevers of 101 or more.

## 2020-08-24 ENCOUNTER — Encounter (HOSPITAL_BASED_OUTPATIENT_CLINIC_OR_DEPARTMENT_OTHER): Payer: Self-pay | Admitting: Obstetrics and Gynecology

## 2020-08-24 ENCOUNTER — Emergency Department (HOSPITAL_BASED_OUTPATIENT_CLINIC_OR_DEPARTMENT_OTHER)
Admission: EM | Admit: 2020-08-24 | Discharge: 2020-08-24 | Disposition: A | Discharge status: Home / Self Care | Payer: BC Managed Care – PPO | Attending: Emergency Medicine | Admitting: Emergency Medicine

## 2020-08-24 ENCOUNTER — Other Ambulatory Visit: Payer: Self-pay

## 2020-08-24 DIAGNOSIS — E1165 Type 2 diabetes mellitus with hyperglycemia: Secondary | ICD-10-CM | POA: Insufficient documentation

## 2020-08-24 DIAGNOSIS — R739 Hyperglycemia, unspecified: Secondary | ICD-10-CM | POA: Diagnosis present

## 2020-08-24 DIAGNOSIS — N181 Chronic kidney disease, stage 1: Secondary | ICD-10-CM | POA: Diagnosis not present

## 2020-08-24 DIAGNOSIS — Z794 Long term (current) use of insulin: Secondary | ICD-10-CM | POA: Insufficient documentation

## 2020-08-24 DIAGNOSIS — E1122 Type 2 diabetes mellitus with diabetic chronic kidney disease: Secondary | ICD-10-CM | POA: Insufficient documentation

## 2020-08-24 LAB — BASIC METABOLIC PANEL
Anion gap: 12 (ref 5–15)
BUN: 11 mg/dL (ref 6–20)
CO2: 23 mmol/L (ref 22–32)
Calcium: 9.5 mg/dL (ref 8.9–10.3)
Chloride: 101 mmol/L (ref 98–111)
Creatinine, Ser: 0.72 mg/dL (ref 0.44–1.00)
GFR, Estimated: >60 mL/min (ref >60)
Glucose, Bld: 314 mg/dL — ABNORMAL HIGH (ref 70–99)
Potassium: 4 mmol/L (ref 3.5–5.1)
Sodium: 136 mmol/L (ref 135–145)

## 2020-08-24 LAB — I-STAT VENOUS BLOOD GAS, ED
Acid-base deficit: 2 mmol/L (ref 0.0–2.0)
Bicarbonate: 23.2 mmol/L (ref 20.0–28.0)
Calcium, Ion: 1.26 mmol/L (ref 1.15–1.40)
HCT: 40 % (ref 36.0–46.0)
Hemoglobin: 13.6 g/dL (ref 12.0–15.0)
O2 Saturation: 65 %
Patient temperature: 98.6
Potassium: 3.8 mmol/L (ref 3.5–5.1)
Sodium: 135 mmol/L (ref 135–145)
TCO2: 24 mmol/L (ref 22–32)
pCO2, Ven: 41.6 mmHg — ABNORMAL LOW (ref 44.0–60.0)
pH, Ven: 7.354 (ref 7.250–7.430)
pO2, Ven: 35 mmHg (ref 32.0–45.0)

## 2020-08-24 LAB — URINALYSIS, ROUTINE W REFLEX MICROSCOPIC
Bilirubin Urine: NEGATIVE
Glucose, UA: >1000 mg/dL — AB
Hgb urine dipstick: NEGATIVE
Ketones, ur: >80 mg/dL — AB
Leukocytes,Ua: NEGATIVE
Nitrite: NEGATIVE
Protein, ur: NEGATIVE mg/dL
Specific Gravity, Urine: 1.036 — ABNORMAL HIGH (ref 1.005–1.030)
pH: 5.5 (ref 5.0–8.0)

## 2020-08-24 LAB — CBG MONITORING, ED
Glucose-Capillary: 263 mg/dL — ABNORMAL HIGH (ref 70–99)
Glucose-Capillary: 223 mg/dL — ABNORMAL HIGH (ref 70–99)

## 2020-08-24 LAB — CBC
HCT: 44.8 % (ref 36.0–46.0)
Hemoglobin: 14.2 g/dL (ref 12.0–15.0)
MCH: 27.6 pg (ref 26.0–34.0)
MCHC: 31.7 g/dL (ref 30.0–36.0)
MCV: 87 fL (ref 80.0–100.0)
Platelets: 327 10*3/uL (ref 150–400)
RBC: 5.15 MIL/uL — ABNORMAL HIGH (ref 3.87–5.11)
RDW: 13 % (ref 11.5–15.5)
WBC: 4.1 10*3/uL (ref 4.0–10.5)
nRBC: 0 % (ref 0.0–0.2)

## 2020-08-24 LAB — PREGNANCY, URINE: Preg Test, Ur: NEGATIVE

## 2020-08-24 LAB — BETA-HYDROXYBUTYRIC ACID: Beta-Hydroxybutyric Acid: 1.35 mmol/L — ABNORMAL HIGH (ref 0.05–0.27)

## 2020-08-24 MED ORDER — SODIUM CHLORIDE 0.9 % IV BOLUS
1000.0000 mL | Freq: Once | INTRAVENOUS | Status: AC
  Administered 2020-08-24: 1000 mL via INTRAVENOUS

## 2020-08-24 MED ORDER — INSULIN ASPART 100 UNIT/ML ~~LOC~~ SOLN
10.0000 [IU] | Freq: Once | SUBCUTANEOUS | Status: AC
  Administered 2020-08-24: 10 [IU] via SUBCUTANEOUS

## 2020-08-24 NOTE — ED Notes (Signed)
Pt's CBG result was 263. Informed Kaitlin - RN.

## 2020-08-24 NOTE — ED Triage Notes (Signed)
Patient reports to the ER for hyperglycemia. Patient reports she is a type 1 diabetic and her CBG was 297 on her phone this morning.

## 2020-08-24 NOTE — ED Notes (Signed)
ED Provider at bedside. 

## 2020-08-24 NOTE — Discharge Instructions (Addendum)
Increase Tresiba to 24 U daily until pump is fixed. Continue to dose sliding scale insulin. Contact your pump specialist tomorrow.

## 2020-08-24 NOTE — ED Notes (Signed)
Patient given Popcorn and icewater with MD approval.

## 2020-08-24 NOTE — ED Provider Notes (Signed)
[facility] EMERGENCY DEPT Provider Note   CSN: [id number] Arrival date & time: 08/24/20  1101     History Chief Complaint  Patient presents with  . Hyperglycemia    Nicole Duarte is a 25 y.o. female.  Has had current pump for one year and an insulin pump in general for 10 years.  The history is provided by the patient.  Hyperglycemia Blood sugar level PTA:  >400 Severity:  Severe Onset quality:  Sudden Duration:  3 days Timing:  Intermittent Progression:  Waxing and waning Chronicity:  New Diabetes status:  Controlled with insulin Current diabetic therapy:  Insulin pump but has stopped working, so she is checking her sugar and using the pump to determine the amount of insulin she should take Context: not change in medication, not recent change in diet and not recent illness   Relieved by:  Insulin Associated symptoms: nausea, polyuria and vomiting   Associated symptoms: no abdominal pain, no blurred vision, no chest pain, no confusion, no dysuria, no fever and no shortness of breath        Past Medical History:  Diagnosis Date  . Chronic kidney disease    Stage I  . Diabetes mellitus without complication (HCC)   . OM (otitis media), recurrent    Recent bilat rupture of TM Feb 2013    Patient Active Problem List   Diagnosis Date Noted  . Insomnia 07/16/2014  . Depression 07/16/2014  . Fibrocystic breast changes 06/30/2012  . Type I diabetes mellitus, well controlled (HCC) 06/30/2012  . Candidal intertrigo 09/01/2011    History reviewed. No pertinent surgical history.   OB History    Gravida  1   Para      Term      Preterm      AB  1   Living  0     SAB  1   IAB      Ectopic      Multiple      Live Births              Family History  Problem Relation Age of Onset  . Hyperlipidemia Mother   . Depression Mother   . Diabetes Paternal Uncle   . Diabetes Paternal Grandfather     Social History   Tobacco Use   . Smoking status: Never Smoker  . Smokeless tobacco: Never Used  Substance Use Topics  . Alcohol use: Yes    Alcohol/week: 1.0 standard drink    Types: 1 Glasses of wine per week  . Drug use: No    Home Medications Prior to Admission medications   Medication Sig Start Date End Date Taking? Authorizing Provider  albuterol (VENTOLIN HFA) 108 (90 Base) MCG/ACT inhaler Inhale 2 puffs into the lungs every 4 (four) hours as needed for wheezing or shortness of breath. 08/07/20  Yes Rodriguez-Southworth, Nettie Elm, PA-C  ibuprofen (ADVIL) 800 MG tablet Take 1 tablet (800 mg total) by mouth 3 (three) times daily. 03/12/19  Yes Long, Arlyss Repress, MD  Insulin Human (INSULIN PUMP) SOLN Inject into the skin.   Yes [provider]  dicyclomine (BENTYL) 20 MG tablet Take 1 tablet (20 mg total) by mouth 2 (two) times daily. 10/06/19   Hyman Hopes, Margaux, PA-C  ondansetron (ZOFRAN ODT) 4 MG disintegrating tablet Take 1 tablet (4 mg total) by mouth every 8 (eight) hours as needed for nausea or vomiting. 10/06/19   Hyman Hopes, Margaux, PA-C  omeprazole (PRILOSEC) 20 MG capsule Take 1  capsule (20 mg total) by mouth daily. Patient not taking: Reported on 10/06/2019 02/23/16 08/07/20  Benjiman Core D, PA-C    Allergies    Patient has no known allergies.  Review of Systems   Review of Systems  Constitutional: Negative for chills and fever.  HENT: Negative for ear pain and sore throat.   Eyes: Negative for blurred vision, pain and visual disturbance.  Respiratory: Negative for cough and shortness of breath.   Cardiovascular: Negative for chest pain and palpitations.  Gastrointestinal: Positive for nausea and vomiting. Negative for abdominal pain.  Endocrine: Positive for polyuria.  Genitourinary: Negative for dysuria and hematuria.  Musculoskeletal: Negative for arthralgias and back pain.  Skin: Negative for color change and rash.  Neurological: Negative for seizures and syncope.  Psychiatric/Behavioral:  Negative for confusion.  All other systems reviewed and are negative.   Physical Exam Updated Vital Signs BP (!) 140/99 (BP Location: Right Arm)   Pulse 99   Temp 98.5 F (36.9 C)   Resp 16   Ht 5\' 5"  (1.651 m)   Wt 79.4 kg   LMP 08/12/2020 (Exact Date)   SpO2 100%   BMI 29.12 kg/m   Physical Exam Vitals and nursing note reviewed.  Constitutional:      General: She is not in acute distress.    Appearance: She is well-developed.  HENT:     Head: Normocephalic and atraumatic.  Eyes:     Conjunctiva/sclera: Conjunctivae normal.  Cardiovascular:     Rate and Rhythm: Normal rate and regular rhythm.     Heart sounds: No murmur heard.   Pulmonary:     Effort: Pulmonary effort is normal. No respiratory distress.     Breath sounds: Normal breath sounds.  Abdominal:     Palpations: Abdomen is soft.     Tenderness: There is no abdominal tenderness.  Musculoskeletal:     Cervical back: Neck supple.  Skin:    General: Skin is warm and dry.  Neurological:     General: No focal deficit present.     Mental Status: She is alert and oriented to person, place, and time.  Psychiatric:        Mood and Affect: Mood normal.     ED Results / Procedures / Treatments   Labs (all labs ordered are listed, but only abnormal results are displayed) Labs Reviewed  BASIC METABOLIC PANEL - Abnormal; Notable for the following components:      Result Value   Glucose, Bld 314 (*)    All other components within normal limits  CBC - Abnormal; Notable for the following components:   RBC 5.15 (*)    All other components within normal limits  URINALYSIS, ROUTINE W REFLEX MICROSCOPIC - Abnormal; Notable for the following components:   Color, Urine COLORLESS (*)    Specific Gravity, Urine 1.036 (*)    Glucose, UA >1,000 (*)    Ketones, ur >80 (*)    All other components within normal limits  CBG MONITORING, ED - Abnormal; Notable for the following components:   Glucose-Capillary 263 (*)     All other components within normal limits  I-STAT VENOUS BLOOD GAS, ED - Abnormal; Notable for the following components:   pCO2, Ven 41.6 (*)    All other components within normal limits  PREGNANCY, URINE  BETA-HYDROXYBUTYRIC ACID    EKG None  Radiology No results found.  Procedures Procedures   Medications Ordered in ED Medications  sodium chloride 0.9 % bolus 1,000 mL (  1,000 mLs Intravenous New Bag/Given 08/24/20 1157)  insulin aspart (novoLOG) injection 10 Units (10 Units Subcutaneous Given 08/24/20 1250)    ED Course  I have reviewed the triage vital signs and the nursing notes.  Pertinent labs & imaging results that were available during my care of the patient were reviewed by me and considered in my medical decision making (see chart for details).    MDM Rules/Calculators/A&P                          Emersyn Esmeralda Links presents with hyperglycemia.  She is a type I diabetic with an insulin pump, but the pump has not been working for a few days, and she has discontinued it.  She has been using it to dose sliding scale insulin.  She informed me that she does take [place], and I added this to her medication list.  She is on 22 units/day, but it appears that she may need to increase her basal insulin rate at this point.  She will increase it to 24 units.  She does have a pump specialist, and she will contact them tomorrow.  No obvious reason that she is hyperglycemic.  Labs do not appear consistent with DKA.  I did consider sources of infection or other underlying contributors to her hyperglycemia.  However, at this point it does seem consistent with lack of a functioning pump.  She was treated for her hyperglycemia here and discharged home with recommendations to follow closely with her pump representative.  She would also like to switch endocrinologist.  She was given contact information for an alternative group. Final Clinical Impression(s) / ED Diagnoses Final diagnoses:   Hyperglycemia    Rx / DC Orders ED Discharge Orders    None       Koleen Distance, MD 08/24/20 1341

## 2020-10-30 ENCOUNTER — Ambulatory Visit: Payer: BC Managed Care – PPO | Admitting: Internal Medicine

## 2021-01-21 ENCOUNTER — Encounter: Payer: Self-pay | Admitting: Internal Medicine

## 2021-01-21 ENCOUNTER — Ambulatory Visit (INDEPENDENT_AMBULATORY_CARE_PROVIDER_SITE_OTHER): Payer: BC Managed Care – PPO | Admitting: Internal Medicine

## 2021-01-21 ENCOUNTER — Other Ambulatory Visit: Payer: Self-pay

## 2021-01-21 VITALS — BP 138/90 | HR 93 | Ht 65.0 in | Wt 186.8 lb

## 2021-01-21 DIAGNOSIS — E109 Type 1 diabetes mellitus without complications: Secondary | ICD-10-CM | POA: Diagnosis not present

## 2021-01-21 LAB — MICROALBUMIN / CREATININE URINE RATIO
Creatinine,U: 51.2 mg/dL
Microalb Creat Ratio: 1.4 mg/g (ref 0.0–30.0)
Microalb, Ur: <0.7 mg/dL (ref 0.0–1.9)

## 2021-01-21 LAB — LIPID PANEL
Cholesterol: 186 mg/dL (ref 0–200)
HDL: 99.3 mg/dL (ref >39.00)
LDL Cholesterol: 76 mg/dL (ref 0–99)
NonHDL: 87.19
Total CHOL/HDL Ratio: 2
Triglycerides: 54 mg/dL (ref 0.0–149.0)
VLDL: 10.8 mg/dL (ref 0.0–40.0)

## 2021-01-21 LAB — BASIC METABOLIC PANEL
BUN: 11 mg/dL (ref 6–23)
CO2: 24 mEq/L (ref 19–32)
Calcium: 9.5 mg/dL (ref 8.4–10.5)
Chloride: 102 mEq/L (ref 96–112)
Creatinine, Ser: 0.71 mg/dL (ref 0.40–1.20)
GFR: 116.4 mL/min (ref >60.00)
Glucose, Bld: 243 mg/dL — ABNORMAL HIGH (ref 70–99)
Potassium: 4.2 mEq/L (ref 3.5–5.1)
Sodium: 136 mEq/L (ref 135–145)

## 2021-01-21 LAB — TSH: TSH: 0.78 u[IU]/mL (ref 0.35–5.50)

## 2021-01-21 LAB — T4, FREE: Free T4: 0.83 ng/dL (ref 0.60–1.60)

## 2021-01-21 NOTE — Patient Instructions (Signed)
-   Decrease Lantus to 24 units daily  - Change Novolog to carbohydrate ratio to 1 unit for every 10 grams  -Novolog correctional insulin: ADD extra units on insulin to your meal-time Novolog dose if your blood sugars are higher than 170. Use the scale below to help guide you:   Blood sugar before meal Number of units to inject  Less than 170 0 unit  171-  210 1 units  211 -  250 2 units  251 -  290 3 units  291 -  330 4 units  331 -  370 5 units  371 -  410 6 units  411 -  450 7 units  451 -  490 8 units      Check out the Tandem and Omnipod 5  HOW TO TREAT LOW BLOOD SUGARS (Blood sugar LESS THAN 70 MG/DL) Please follow the RULE OF 15 for the treatment of hypoglycemia treatment (when your (blood sugars are less than 70 mg/dL)   STEP 1: Take 15 grams of carbohydrates when your blood sugar is low, which includes:  3-4 GLUCOSE TABS  OR 3-4 OZ OF JUICE OR REGULAR SODA OR ONE TUBE OF GLUCOSE GEL    STEP 2: RECHECK blood sugar in 15 MINUTES STEP 3: If your blood sugar is still low at the 15 minute recheck --> then, go back to STEP 1 and treat AGAIN with another 15 grams of carbohydrates.

## 2021-01-21 NOTE — Progress Notes (Signed)
Name: Nicole Duarte  MRN/ DOB: [id number], 06/16/1995   Age/ Sex: 25 y.o., female    PCP: Tisovec, Adelfa Koh, MD   Reason for Endocrinology Evaluation: Type 1 Diabetes Mellitus     Date of Initial Endocrinology Visit: 01/21/2021     PATIENT IDENTIFIER: Nicole Duarte is a 25 y.o. female with a past medical history of T1DM. The patient presented for initial endocrinology clinic visit on 01/21/2021 for consultative assistance with her diabetes management.    HPI: Ms. Gravlin was    Diagnosed with DM 2013 Prior Medications tried/Intolerance: Was on t-slim in 2013. Switched to Medtronic but has issues with it, and stopped 8/ 2022 Currently checking blood sugars multiple  x / day,  through CGM  Hypoglycemia episodes : yes               Symptoms: yes                 Frequency: multiples x a day  Hemoglobin A1c has ranged from 6.6% in 2022, peaking at 14.0% in 2013. 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   She is on Nexplanon   HOME DIABETES REGIMEN: Tresiba 28 units daily  Novolog 1:12 grams  CF  1 units for every 70    Statin: no ACE-I/ARB: no Prior Diabetic Education: yes    CONTINUOUS GLUCOSE MONITORING RECORD INTERPRETATION    Dates of Recording: 9/1-9/14/2022  Sensor description:freestyle   Results statistics:   CGM use % of time 47  Average and SD 128/41.3  Time in range    70    %  % Time Above 180 14  % Time above 250 2  % Time Below target 13      Glycemic patterns summary: Fluctuating hyperglycemia with hypoglycemia   Hyperglycemic episodes  during the day  Hypoglycemic episodes occurred during the day and nigh   Overnight periods: trends down        DIABETIC  COMPLICATIONS: Microvascular complications:   Denies: retinopathy, neuropathy, CKD  Last eye exam: Completed 11/2020  Macrovascular complications:   Denies: CAD, PVD, CVA   PAST HISTORY: Past Medical History:  Past Medical History:  Diagnosis Date   Chronic kidney disease    Stage I   Diabetes mellitus without complication (HCC)    OM (otitis media), recurrent    Recent bilat rupture of TM Feb 2013   Past Surgical History: No past surgical history on file.  Social History:  reports that she has never smoked. She has never used smokeless tobacco. She reports current alcohol use of about 1.0 standard drink per week. She reports that she does not use drugs. Family History:  Family History  Problem Relation Age of Onset   Hyperlipidemia Mother    Depression Mother    Diabetes Paternal Uncle    Diabetes Paternal Grandfather      HOME MEDICATIONS: Allergies as of 01/21/2021   No Known Allergies      Medication List        Accurate as of January 21, 2021 11:01 AM. If you have any questions, ask your nurse or doctor.          albuterol 108 (90 Base) MCG/ACT inhaler Commonly  known as: VENTOLIN HFA Inhale 2 puffs into the lungs every 4 (four) hours as needed for wheezing or shortness of breath.   dicyclomine 20 MG tablet Commonly known as: BENTYL Take 1 tablet (20 mg total) by mouth 2 (two) times daily.   ibuprofen 800 MG tablet Commonly known as: ADVIL Take 1 tablet (800 mg total) by mouth 3 (three) times daily.   insulin pump Soln Inject into the skin.   ondansetron 4 MG disintegrating tablet Commonly known as: Zofran ODT Take 1 tablet (4 mg total) by mouth every 8 (eight) hours as needed for nausea or vomiting.   Evaristo Bury FlexTouch 100 UNIT/ML FlexTouch Pen Generic drug: insulin degludec Inject 22 Units into the skin daily.         ALLERGIES: No Known Allergies   REVIEW OF SYSTEMS: A comprehensive ROS was conducted with the patient and is  negative except as per HPI and below:  Review of Systems  Gastrointestinal:  Negative for diarrhea and nausea.  Neurological:  Negative for tingling.     OBJECTIVE:   VITAL SIGNS: BP 138/90 (BP Location: Left Arm, Patient Position: Sitting, Cuff Size: Small)   Pulse 93   Ht 5\' 5"  (1.651 m)   Wt 186 lb 12.8 oz (84.7 kg)   SpO2 97%   BMI 31.09 kg/m    PHYSICAL EXAM:  General: Pt appears well and is in NAD  Neck: General: Supple without adenopathy or carotid bruits. Thyroid: Thyroid size normal.  No goiter or nodules appreciated.  Lungs: Clear with good BS bilat with no rales, rhonchi, or wheezes  Heart: RRR with normal S1 and S2 and no gallops; no murmurs; no rub  Abdomen: Normoactive bowel sounds, soft, nontender, without masses or organomegaly palpable  Extremities:  Lower extremities - No pretibial edema. No lesions.  Skin: Normal texture and temperature to palpation. No rash noted. No Acanthosis nigricans/skin tags. No lipohypertrophy.  Neuro: MS is good with appropriate affect, pt is alert and Ox3    DM foot exam: 01/21/2021  The skin of the feet is intact without sores or ulcerations. The pedal pulses are 2+ on right and 2+ on left. The sensation is intact to a screening 5.07, 10 gram monofilament bilaterally    DATA REVIEWED:  Lab Results  Component Value Date   HGBA1C 14.0 (H) 09/01/2011  Results for BERNESTINE, HOLSAPPLE (MRN Gabriel Rainwater) as of 01/22/2021 12:18  Ref. Range 01/21/2021 11:44  BASIC METABOLIC PANEL Unknown Rpt (A)  Sodium Latest Ref Range: 135 - 145 mEq/L 136  Potassium Latest Ref Range: 3.5 - 5.1 mEq/L 4.2  Chloride Latest Ref Range: 96 - 112 mEq/L 102  CO2 Latest Ref Range: 19 - 32 mEq/L 24  Glucose Latest Ref Range: 70 - 99 mg/dL 01/23/2021 (H)  BUN Latest Ref Range: 6 - 23 mg/dL 11  Creatinine Latest Ref Range: 0.40 - 1.20 mg/dL 720  Calcium Latest Ref Range: 8.4 - 10.5 mg/dL 9.5  GFR Latest Ref Range: >60.00 mL/min 116.40  Total CHOL/HDL Ratio  Unknown 2  Cholesterol Latest Ref Range: 0 - 200 mg/dL 9.47  HDL Cholesterol Latest Ref Range: >39.00 mg/dL 096  LDL (calc) Latest Ref Range: 0 - 99 mg/dL 76  MICROALB/CREAT RATIO Latest Ref Range: 0.0 - 30.0 mg/g 1.4  NonHDL Unknown 87.19  Triglycerides Latest Ref Range: 0.0 - 149.0 mg/dL 28.36  VLDL Latest Ref Range: 0.0 - 40.0 mg/dL 62.9  TSH Latest Ref Range: 0.35 - 5.50 uIU/mL 0.78  T4,Free(Direct) Latest Ref  Range: 0.60 - 1.60 ng/dL 2.13  Creatinine,U Latest Units: mg/dL 08.6  Microalb, Ur Latest Ref Range: 0.0 - 1.9 mg/dL <5.7    ASSESSMENT / PLAN / RECOMMENDATIONS:   1) Type 1 Diabetes Mellitus, optimally controlled, Without complications - Most recent A1c of 6.6 %. Goal A1c < 7.0 %.     I have discussed with the patient the pathophysiology of diabetes. We went over the natural progression of the disease. I explained the complications associated with diabetes including retinopathy, nephropathy, neuropathy as well as increased risk of cardiovascular disease. We went over the benefit seen with glycemic control.   Discussed pharmacokinetics of basal/bolus insulin and the importance of taking prandial insulin with meals.  We also discussed avoiding sugar-sweetened beverages and snacks, when possible.  Will make the following changes She has been on the tandem as well as the Medtronic, she is happy with the pens at this time she was asked to check into the OmniPod  MEDICATIONS: Decrease Lantus to 24 units daily  Change Novolog to carbohydrate ratio to 1 unit for every 10 grams  Correction factor: NovoLog (BG -130/40)  EDUCATION / INSTRUCTIONS: BG monitoring instructions: Patient is instructed to check her blood sugars 3 times a day, before meals. Call  Endocrinology clinic if: BG persistently < 70 I reviewed the Rule of 15 for the treatment of hypoglycemia in detail with the patient. Literature supplied.   2) Diabetic complications:  Eye: Does not have known diabetic  retinopathy.  Neuro/ Feet: Does not have known diabetic peripheral neuropathy. Renal: Patient does not have known baseline CKD. She is not on an ACEI/ARB at present.  3) Lipids: Lipid panel is normal.  No indication for statin therapy        Signed electronically by: Lyndle Herrlich, MD  [facility]  [facility] [address] [place], [place] [address] Phone: [phone] FAX: [phone]   CC: Gaspar Garbe, MD [address] [place] [address] Phone: [phone]  Fax: [phone]    Return to Endocrinology clinic as below: No future appointments.

## 2021-01-22 MED ORDER — TRESIBA FLEXTOUCH 100 UNIT/ML ~~LOC~~ SOPN: 24.0000 [IU] | PEN_INJECTOR | Freq: Every day | SUBCUTANEOUS | 3 refills | Status: AC

## 2021-01-22 MED ORDER — NOVOLOG FLEXPEN 100 UNIT/ML ~~LOC~~ SOPN: PEN_INJECTOR | SUBCUTANEOUS | 6 refills | Status: DC

## 2021-01-22 MED ORDER — BD PEN NEEDLE NANO 2ND GEN 32G X 4 MM MISC: 1.0000 | Freq: Four times a day (QID) | 3 refills | Status: AC

## 2021-02-06 ENCOUNTER — Encounter: Payer: Self-pay | Admitting: Internal Medicine

## 2021-04-27 ENCOUNTER — Ambulatory Visit: Payer: BC Managed Care – PPO | Admitting: Internal Medicine

## 2021-04-27 NOTE — Progress Notes (Deleted)
Name: [name]  MRN/ DOB: [id number], [birth year]   Age/ Sex: 25 y.o., female    PCP: Tisovec, Fransico Him, MD   Reason for Endocrinology Evaluation: Type 1 Diabetes Mellitus     Date of Initial Endocrinology Visit: 01/21/2021    PATIENT IDENTIFIER: Nicole Duarte is a 25 y.o. female with a past medical history of T1DM. The patient presented for initial endocrinology clinic visit on 01/21/2021 for consultative assistance with her diabetes management.    HPI: [name] was    Diagnosed with DM 2013 Prior Medications tried/Intolerance: Was on t-slim in 2013. Switched to Medtronic but has issues with it, and stopped 8/ 2022 Hemoglobin A1c has ranged from 6.6% in 2022, peaking at 14.0% in 2013. 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 Madero is here for a follow up on diabetes management. She checks her blood sugars *** times daily, preprandial to breakfast and ***. The patient has *** had hypoglycemic episodes since the last clinic visit, which typically occur *** x / - most often occuring ***. The patient is *** symptomatic with these episodes, with symptoms of {symptoms; hypoglycemia:9084048}.     HOME DIABETES REGIMEN: Lantus 24 units daily  Novolog 1:10 grams  CF  Novolog (BG-130/40)    Statin: no ACE-I/ARB: no Prior Diabetic Education: yes    CONTINUOUS GLUCOSE MONITORING RECORD INTERPRETATION    Dates of Recording: 9/1-9/14/2022  Sensor description:freestyle   Results statistics:   CGM use % of time 47  Average and SD 128/41.3  Time in range    70    %  % Time Above 180 14  % Time above 250 2  % Time Below target 13      Glycemic patterns summary: Fluctuating hyperglycemia with  hypoglycemia   Hyperglycemic episodes  during the day  Hypoglycemic episodes occurred during the day and nigh   Overnight periods: trends down        DIABETIC COMPLICATIONS: Microvascular complications:   Denies: retinopathy, neuropathy, CKD  Last eye exam: Completed 11/2020  Macrovascular complications:   Denies: CAD, PVD, CVA   PAST HISTORY: Past Medical History:  Past Medical History:  Diagnosis Date   Chronic kidney disease    Stage I   Diabetes mellitus without complication (HCC)    OM (otitis media), recurrent    Recent bilat rupture of TM Feb 2013   Past Surgical History: No past surgical history on file.  Social History:  reports that she has never smoked. She has never used smokeless tobacco. She reports current alcohol use of about 1.0 standard drink per week. She reports that she does not use drugs. Family History:  Family History  Problem Relation Age of Onset   Hyperlipidemia Mother    Depression Mother    Diabetes Paternal Uncle    Diabetes Paternal Grandfather      HOME MEDICATIONS: Allergies as of 04/27/2021   No Known Allergies      Medication List        Accurate as of April 27, 2021  7:17 AM. If you have any questions, ask your nurse or doctor.          BD Pen Needle Nano 2nd Gen  32G X 4 MM Misc Generic drug: Insulin Pen Needle Inject 1 Device into the skin 4 (four) times daily. as directed   FreeStyle Libre 2 Sensor Misc Apply topically every 14 (fourteen) days.   NovoLOG FlexPen 100 UNIT/ML FlexPen Generic drug: insulin aspart Max daily 50 units   Tresiba FlexTouch 100 UNIT/ML FlexTouch Pen Generic drug: insulin degludec Inject 24 Units into the skin daily.         ALLERGIES: No Known Allergies   REVIEW OF SYSTEMS: A comprehensive ROS was conducted with the patient and is negative except as per HPI and below:  Review of Systems  Gastrointestinal:  Negative for diarrhea and nausea.  Neurological:  Negative  for tingling.     OBJECTIVE:   VITAL SIGNS: There were no vitals taken for this visit.   PHYSICAL EXAM:  General: Pt appears well and is in NAD  Neck: General: Supple without adenopathy or carotid bruits. Thyroid: Thyroid size normal.  No goiter or nodules appreciated.  Lungs: Clear with good BS bilat with no rales, rhonchi, or wheezes  Heart: RRR with normal S1 and S2 and no gallops; no murmurs; no rub  Abdomen: Normoactive bowel sounds, soft, nontender, without masses or organomegaly palpable  Extremities:  Lower extremities - No pretibial edema. No lesions.  Skin: Normal texture and temperature to palpation. No rash noted. No Acanthosis nigricans/skin tags. No lipohypertrophy.  Neuro: MS is good with appropriate affect, pt is alert and Ox3    DM foot exam: 01/21/2021  The skin of the feet is intact without sores or ulcerations. The pedal pulses are 2+ on right and 2+ on left. The sensation is intact to a screening 5.07, 10 gram monofilament bilaterally    DATA REVIEWED:  Lab Results  Component Value Date   HGBA1C 14.0 (H) 09/01/2011  Results for Nicole Duarte, Nicole Duarte (MRN [id number]) as of 01/22/2021 12:18  Ref. Range 01/21/2021 11:44  BASIC METABOLIC PANEL Unknown Rpt (A)  Sodium Latest Ref Range: 135 - 145 mEq/L 136  Potassium Latest Ref Range: 3.5 - 5.1 mEq/L 4.2  Chloride Latest Ref Range: 96 - 112 mEq/L 102  CO2 Latest Ref Range: 19 - 32 mEq/L 24  Glucose Latest Ref Range: 70 - 99 mg/dL 409243 (H)  BUN Latest Ref Range: 6 - 23 mg/dL 11  Creatinine Latest Ref Range: 0.40 - 1.20 mg/dL 8.110.71  Calcium Latest Ref Range: 8.4 - 10.5 mg/dL 9.5  GFR Latest Ref Range: >60.00 mL/min 116.40  Total CHOL/HDL Ratio Unknown 2  Cholesterol Latest Ref Range: 0 - 200 mg/dL 914186  HDL Cholesterol Latest Ref Range: >39.00 mg/dL 78.2999.30  LDL (calc) Latest Ref Range: 0 - 99 mg/dL 76  MICROALB/CREAT RATIO Latest Ref Range: 0.0 - 30.0 mg/g 1.4  NonHDL Unknown 87.19  Triglycerides Latest Ref  Range: 0.0 - 149.0 mg/dL 56.254.0  VLDL Latest Ref Range: 0.0 - 40.0 mg/dL 13.010.8  TSH Latest Ref Range: 0.35 - 5.50 uIU/mL 0.78  T4,Free(Direct) Latest Ref Range: 0.60 - 1.60 ng/dL 8.650.83  Creatinine,U Latest Units: mg/dL 78.451.2  Microalb, Ur Latest Ref Range: 0.0 - 1.9 mg/dL <6.9<0.7    ASSESSMENT / PLAN / RECOMMENDATIONS:   1) Type 1 Diabetes Mellitus, optimally controlled, Without complications - Most recent A1c of 6.6 %. Goal A1c < 7.0 %.     I have discussed with the patient the pathophysiology of diabetes. We went over the natural progression of the disease. I explained the complications associated with diabetes including retinopathy, nephropathy, neuropathy as  well as increased risk of cardiovascular disease. We went over the benefit seen with glycemic control.   Discussed pharmacokinetics of basal/bolus insulin and the importance of taking prandial insulin with meals.  We also discussed avoiding sugar-sweetened beverages and snacks, when possible.  Will make the following changes She has been on the tandem as well as the Medtronic, she is happy with the pens at this time she was asked to check into the OmniPod  MEDICATIONS: Decrease Lantus to 24 units daily  Change Novolog to carbohydrate ratio to 1 unit for every 10 grams  Correction factor: NovoLog (BG -130/40)  EDUCATION / INSTRUCTIONS: BG monitoring instructions: Patient is instructed to check her blood sugars 3 times a day, before meals. Call [facility] clinic if: BG persistently < 70 I reviewed the Rule of 15 for the treatment of hypoglycemia in detail with the patient. Literature supplied.   2) Diabetic complications:  Eye: Does not have known diabetic retinopathy.  Neuro/ Feet: Does not have known diabetic peripheral neuropathy. Renal: Patient does not have known baseline CKD. She is not on an ACEI/ARB at present.  3) Lipids: Lipid panel is normal.  No indication for statin therapy        Signed  electronically by: Lyndle Herrlich, MD  [facility]  [facility] [address] [place], [place] [address] Phone: [phone] FAX: [phone]   CC: Gaspar Garbe, MD [address] [place] [address] Phone: [phone]  Fax: [phone]    Return to Endocrinology clinic as below: Future Appointments  Date Time Provider Department Center  04/27/2021  7:30 AM Athony Coppa, Konrad Dolores, MD [id number] None

## 2022-01-28 ENCOUNTER — Other Ambulatory Visit: Payer: Self-pay | Admitting: Internal Medicine

## 2023-07-31 ENCOUNTER — Telehealth: Admitting: Physician Assistant

## 2023-07-31 DIAGNOSIS — B354 Tinea corporis: Secondary | ICD-10-CM

## 2023-07-31 MED ORDER — KETOCONAZOLE 2 % EX SHAM: 1.0000 | MEDICATED_SHAMPOO | CUTANEOUS | 0 refills | Status: AC

## 2023-07-31 MED ORDER — CLOTRIMAZOLE 1 % EX CREA: 1.0000 | TOPICAL_CREAM | Freq: Two times a day (BID) | CUTANEOUS | 0 refills | Status: AC

## 2023-07-31 NOTE — Patient Instructions (Signed)
  Nicole Duarte, thank you for joining Tylene Fantasia Ward, PA-C for today's virtual visit.  While this provider is not your primary care provider (PCP), if your PCP is located in our provider database this encounter information will be shared with them immediately following your visit.   A [facility] MyChart account gives you access to today's visit and all your visits, tests, and labs performed at [facility] " click here if you don't have a [facility] MyChart account or go to [email or web address]  Consent: (Patient) Nicole Duarte provided verbal consent for this virtual visit at the beginning of the encounter.  Current Medications:  Current Outpatient Medications:    clotrimazole (LOTRIMIN) 1 % cream, Apply 1 Application topically 2 (two) times daily., Disp: 30 g, Rfl: 0   [START ON 08/01/2023] ketoconazole (NIZORAL) 2 % shampoo, Apply 1 Application topically 2 (two) times a week., Disp: 120 mL, Rfl: 0   BD PEN NEEDLE NANO 2ND GEN 32G X 4 MM MISC, Inject 1 Device into the skin 4 (four) times daily. as directed, Disp: 400 each, Rfl: 3   Continuous Blood Gluc Sensor (FREESTYLE LIBRE 2 SENSOR) MISC, Apply topically every 14 (fourteen) days., Disp: , Rfl:    insulin degludec (TRESIBA FLEXTOUCH) 100 UNIT/ML FlexTouch Pen, Inject 24 Units into the skin daily., Disp: 30 mL, Rfl: 3   NOVOLOG FLEXPEN 100 UNIT/ML FlexPen, INJECT AS DIRECTED.Marland Kitchen UP TO A MAXIMUM OF 50 UNITS PER DAY, Disp: 6 mL, Rfl: 0   Medications ordered in this encounter:  Meds ordered this encounter  Medications   ketoconazole (NIZORAL) 2 % shampoo    Sig: Apply 1 Application topically 2 (two) times a week.    Dispense:  120 mL    Refill:  0    Supervising Provider:   Merrilee Jansky [id number]   clotrimazole (LOTRIMIN) 1 % cream    Sig: Apply 1 Application topically 2 (two) times daily.    Dispense:  30 g    Refill:  0    Supervising Provider:   Merrilee Jansky [[id number]]     *If you need refills on  other medications prior to your next appointment, please contact your pharmacy*  Follow-Up: Call back or seek an in-person evaluation if the symptoms worsen or if the condition fails to improve as anticipated.  [facility] [phone]  Other Instructions Use topical cream for lesions on your back. Recommend ketoconazole shampoo for scalp.  If no improvement recommend in person evaluation with your primary care physician.    If you have been instructed to have an in-person evaluation today at a local Urgent Care facility, please use the link below. It will take you to a list of all of our available [facility], including address, phone number and hours of operation. Please do not delay care.  [facility]  If you or a family member do not have a primary care provider, use the link below to schedule a visit and establish care. When you choose a [facility] primary care physician or advanced practice provider, you gain a long-term partner in health. Find a Primary Care Provider  Learn more about [facility]'s in-office and virtual care options: [facility] - Get Care Now

## 2023-07-31 NOTE — Progress Notes (Signed)
 Virtual Visit Consent   [name], you are scheduled for a virtual visit with a [facility] provider today. Just as with appointments in the office, your consent must be obtained to participate. Your consent will be active for this visit and any virtual visit you may have with one of our providers in the next 365 days. If you have a MyChart account, a copy of this consent can be sent to you electronically.  As this is a virtual visit, video technology does not allow for your provider to perform a traditional examination. This may limit your provider's ability to fully assess your condition. If your provider identifies any concerns that need to be evaluated in person or the need to arrange testing (such as labs, EKG, etc.), we will make arrangements to do so. Although advances in technology are sophisticated, we cannot ensure that it will always work on either your end or our end. If the connection with a video visit is poor, the visit may have to be switched to a telephone visit. With either a video or telephone visit, we are not always able to ensure that we have a secure connection.  By engaging in this virtual visit, you consent to the provision of healthcare and authorize for your insurance to be billed (if applicable) for the services provided during this visit. Depending on your insurance coverage, you may receive a charge related to this service.  I need to obtain your verbal consent now. Are you willing to proceed with your visit today? Nicole Duarte has provided verbal consent on 07/31/2023 for a virtual visit (video or telephone). Tylene Fantasia Ward, PA-C  Date: 07/31/2023 3:31 PM   Virtual Visit via Video Note   I, Tylene Fantasia Ward, connected with  Nicole Duarte  ([id number], 14-Mar-1996) on 07/31/23 at  3:15 PM EDT by a video-enabled telemedicine application and verified that I am speaking with the correct person using two identifiers.  Location: Patient: Virtual Visit  Location Patient: Home Provider: Virtual Visit Location Provider: Home Office   I discussed the limitations of evaluation and management by telemedicine and the availability of in person appointments. The patient expressed understanding and agreed to proceed.    History of Present Illness: Nicole Duarte is a 28 y.o. who identifies as a female who was assigned female at birth, and is being seen today for itching circular lesions to her upper back and scalp that started a few days ago.  Reports recently getting her hair done, more itching after that.  HPI: HPI  Problems:  Patient Active Problem List   Diagnosis Date Noted   Insomnia 07/16/2014   Depression 07/16/2014   Fibrocystic breast changes 06/30/2012   Type I diabetes mellitus, well controlled (HCC) 06/30/2012   Candidal intertrigo 09/01/2011    Allergies: No Known Allergies Medications:  Current Outpatient Medications:    clotrimazole (LOTRIMIN) 1 % cream, Apply 1 Application topically 2 (two) times daily., Disp: 30 g, Rfl: 0   [START ON 08/01/2023] ketoconazole (NIZORAL) 2 % shampoo, Apply 1 Application topically 2 (two) times a week., Disp: 120 mL, Rfl: 0   BD PEN NEEDLE NANO 2ND GEN 32G X 4 MM MISC, Inject 1 Device into the skin 4 (four) times daily. as directed, Disp: 400 each, Rfl: 3   Continuous Blood Gluc Sensor (FREESTYLE LIBRE 2 SENSOR) MISC, Apply topically every 14 (fourteen) days., Disp: , Rfl:    insulin degludec (TRESIBA FLEXTOUCH) 100 UNIT/ML FlexTouch Pen, Inject 24 Units  into the skin daily., Disp: 30 mL, Rfl: 3   NOVOLOG FLEXPEN 100 UNIT/ML FlexPen, INJECT AS DIRECTED.Marland Kitchen UP TO A MAXIMUM OF 50 UNITS PER DAY, Disp: 6 mL, Rfl: 0  Observations/Objective: Patient is well-developed, well-nourished in no acute distress.  Resting comfortably at home.  Head is normocephalic, atraumatic.  No labored breathing.  Speech is clear and coherent with logical content.  Patient is alert and oriented at baseline.     Assessment and Plan: 1. Tinea corporis (Primary)  Recommend topical antifungal for lesions on her back and shampoo for scalp.  If no improvement recommend in person evaluation.   Follow Up Instructions: I discussed the assessment and treatment plan with the patient. The patient was provided an opportunity to ask questions and all were answered. The patient agreed with the plan and demonstrated an understanding of the instructions.  A copy of instructions were sent to the patient via MyChart unless otherwise noted below.     The patient was advised to call back or seek an in-person evaluation if the symptoms worsen or if the condition fails to improve as anticipated.    Tylene Fantasia Ward, PA-C

## 2023-08-04 ENCOUNTER — Other Ambulatory Visit (HOSPITAL_BASED_OUTPATIENT_CLINIC_OR_DEPARTMENT_OTHER): Payer: Self-pay

## 2023-08-04 MED ORDER — LISDEXAMFETAMINE DIMESYLATE 40 MG PO CAPS
40.0000 mg | ORAL_CAPSULE | Freq: Every morning | ORAL | 0 refills | Status: DC
  Filled 2023-08-04: qty 30, 30d supply, fill #0

## 2023-10-03 ENCOUNTER — Other Ambulatory Visit (HOSPITAL_BASED_OUTPATIENT_CLINIC_OR_DEPARTMENT_OTHER): Payer: Self-pay

## 2023-10-03 MED ORDER — LISDEXAMFETAMINE DIMESYLATE 40 MG PO CAPS
40.0000 mg | ORAL_CAPSULE | Freq: Every morning | ORAL | 0 refills | Status: DC
  Filled 2023-10-03: qty 30, 30d supply, fill #0

## 2023-10-04 ENCOUNTER — Other Ambulatory Visit (HOSPITAL_BASED_OUTPATIENT_CLINIC_OR_DEPARTMENT_OTHER): Payer: Self-pay

## 2023-12-01 ENCOUNTER — Other Ambulatory Visit (HOSPITAL_BASED_OUTPATIENT_CLINIC_OR_DEPARTMENT_OTHER): Payer: Self-pay

## 2023-12-01 MED ORDER — LISDEXAMFETAMINE DIMESYLATE 40 MG PO CAPS
40.0000 mg | ORAL_CAPSULE | Freq: Every morning | ORAL | 0 refills | Status: DC
  Filled 2023-12-01 – 2023-12-15 (×2): qty 30, 30d supply, fill #0

## 2023-12-12 ENCOUNTER — Other Ambulatory Visit (HOSPITAL_BASED_OUTPATIENT_CLINIC_OR_DEPARTMENT_OTHER): Payer: Self-pay

## 2023-12-15 ENCOUNTER — Other Ambulatory Visit: Payer: Self-pay

## 2023-12-15 ENCOUNTER — Other Ambulatory Visit (HOSPITAL_BASED_OUTPATIENT_CLINIC_OR_DEPARTMENT_OTHER): Payer: Self-pay

## 2024-02-29 ENCOUNTER — Other Ambulatory Visit (HOSPITAL_BASED_OUTPATIENT_CLINIC_OR_DEPARTMENT_OTHER): Payer: Self-pay

## 2024-02-29 MED ORDER — LISDEXAMFETAMINE DIMESYLATE 40 MG PO CAPS
40.0000 mg | ORAL_CAPSULE | Freq: Every morning | ORAL | 0 refills | Status: DC
  Filled 2024-02-29: qty 30, 30d supply, fill #0

## 2024-05-07 ENCOUNTER — Other Ambulatory Visit (HOSPITAL_BASED_OUTPATIENT_CLINIC_OR_DEPARTMENT_OTHER): Payer: Self-pay

## 2024-05-07 MED ORDER — LISDEXAMFETAMINE DIMESYLATE 40 MG PO CAPS
40.0000 mg | ORAL_CAPSULE | Freq: Every morning | ORAL | 0 refills | Status: AC
  Filled 2024-05-07: qty 30, 30d supply, fill #0

## 2024-05-14 ENCOUNTER — Other Ambulatory Visit (HOSPITAL_BASED_OUTPATIENT_CLINIC_OR_DEPARTMENT_OTHER): Payer: Self-pay
